# Patient Record
Sex: Female | Born: 1975 | Hispanic: Yes | Smoking: Never smoker
Health system: Southern US, Community
[De-identification: ages and names within clinical notes are randomized; demographics above are authoritative.]

## PROBLEM LIST (undated history)

## (undated) DIAGNOSIS — F329 Major depressive disorder, single episode, unspecified: Secondary | ICD-10-CM

## (undated) DIAGNOSIS — E785 Hyperlipidemia, unspecified: Secondary | ICD-10-CM

## (undated) DIAGNOSIS — F32A Depression, unspecified: Secondary | ICD-10-CM

## (undated) HISTORY — DX: Major depressive disorder, single episode, unspecified: F32.9

## (undated) HISTORY — DX: Depression, unspecified: F32.A

## (undated) HISTORY — DX: Hyperlipidemia, unspecified: E78.5

## (undated) HISTORY — PX: APPENDECTOMY: SHX54

---

## 1998-11-12 HISTORY — PX: CHOLECYSTECTOMY: SHX55

## 2018-09-09 ENCOUNTER — Ambulatory Visit: Payer: Managed Care, Other (non HMO) | Admitting: Physician Assistant

## 2018-09-10 ENCOUNTER — Ambulatory Visit (INDEPENDENT_AMBULATORY_CARE_PROVIDER_SITE_OTHER): Payer: Managed Care, Other (non HMO) | Admitting: Physician Assistant

## 2018-09-10 ENCOUNTER — Encounter: Payer: Self-pay | Admitting: Physician Assistant

## 2018-09-10 VITALS — BP 92/62 | HR 80 | Temp 98.0°F | Resp 16 | Ht 60.0 in | Wt 189.0 lb

## 2018-09-10 DIAGNOSIS — G43009 Migraine without aura, not intractable, without status migrainosus: Secondary | ICD-10-CM | POA: Diagnosis not present

## 2018-09-10 MED ORDER — KETOROLAC TROMETHAMINE 60 MG/2ML IM SOLN
60.0000 mg | Freq: Once | INTRAMUSCULAR | Status: AC
  Administered 2018-09-10: 60 mg via INTRAMUSCULAR

## 2018-09-10 MED ORDER — CYCLOBENZAPRINE HCL 5 MG PO TABS: 5.0000 mg | ORAL_TABLET | Freq: Every day | ORAL | 0 refills | Status: DC

## 2018-09-10 MED ORDER — PREDNISONE 20 MG PO TABS: 40.0000 mg | ORAL_TABLET | Freq: Every day | ORAL | 0 refills | Status: DC

## 2018-09-10 NOTE — Patient Instructions (Signed)
It was great to see you!  Start oral prednisone tomorrow. If symptoms persist, please let us know.   May use muscle relaxer, Flexeril, as soon as tonight to help with sleep.  Take care,  Jarold Motto PA-C

## 2018-09-10 NOTE — Progress Notes (Signed)
Jacqueline Marshall is a 42 y.o. female here for a new problem.  History of Present Illness:   Chief Complaint  Patient presents with  . Headache    HPI   Has history of migraines and stress-related headaches.  This headache that she currently has started this past week. Goes to the gym before work -- however has decreased so she can sleep more and is now going 3 x a week instead of 5 x a week. Recently has been under a lot of stress -- someone quit at her job and she is having to do a lot more work than usual. She also has 4 kids and goes to school. She did decide to put this semester on hold so she can try to take a little bit off of her plate.  Has taken Imitrex in the past and it has helped. Has also received Toradol injections in the past and tolerated well.   Does have some photophobia and slight ear pressure.  Denies: slurred speech, weakness, numbness/tingling, n/v, confusion, fever  States that this is typical for her HA and it is not the worst HA of her life.    Past Medical History:  Diagnosis Date  . Depression    zoloft, wellbutrin -- did feel like she had some SAD while in [place]  . Hyperlipidemia    never on medications     Social History   Socioeconomic History  . Marital status: Not on file    Spouse name: Not on file  . Number of children: Not on file  . Years of education: Not on file  . Highest education level: Not on file  Occupational History  . Not on file  Social Needs  . Financial resource strain: Not on file  . Food insecurity:    Worry: Not on file    Inability: Not on file  . Transportation needs:    Medical: Not on file    Non-medical: Not on file  Tobacco Use  . Smoking status: Not on file  Substance and Sexual Activity  . Alcohol use: Not on file  . Drug use: Not on file  . Sexual activity: Not on file  Lifestyle  . Physical activity:    Days per week: Not on file    Minutes per session: Not on file  . Stress: Not on file   Relationships  . Social connections:    Talks on phone: Not on file    Gets together: Not on file    Attends religious service: Not on file    Active member of club or organization: Not on file    Attends meetings of clubs or organizations: Not on file    Relationship status: Not on file  . Intimate partner violence:    Fear of current or ex partner: Not on file    Emotionally abused: Not on file    Physically abused: Not on file    Forced sexual activity: Not on file  Other Topics Concern  . Not on file  Social History Narrative   4 children -- 60, 35, 75, and 7 (3 boys and a girl)   Hx ectopic pregnancy   Work -- [employer]    Past Surgical History:  Procedure Laterality Date  . APPENDECTOMY    . CHOLECYSTECTOMY  2000    Family History  Problem Relation Age of Onset  . Cancer Mother  Breast  . Stroke Mother   . Cancer Maternal Grandfather        Lung    Not on File  Current Medications:   Current Outpatient Medications:  .  cyclobenzaprine (FLEXERIL) 5 MG tablet, Take 1 tablet (5 mg total) by mouth at bedtime., Disp: 20 tablet, Rfl: 0 .  predniSONE (DELTASONE) 20 MG tablet, Take 2 tablets (40 mg total) by mouth daily., Disp: 10 tablet, Rfl: 0   Review of Systems:   ROS Negative unless otherwise specified per HPI.  Vitals:   Vitals:   09/10/18 0807  BP: 92/62  Pulse: 80  Resp: 16  Temp: 98 F (36.7 C)  TempSrc: Oral  SpO2: 99%  Weight: 189 lb (85.7 kg)  Height: 5' (1.524 m)     Body mass index is 36.91 kg/m.  Physical Exam:   Physical Exam  Constitutional: She appears well-developed. She is cooperative.  Non-toxic appearance. She does not have a sickly appearance. She does not appear ill. No distress.  HENT:  Head: Normocephalic and atraumatic.  Right Ear: Tympanic membrane, external ear and ear canal normal. Tympanic membrane is not erythematous, not retracted and not bulging.  Left Ear: Tympanic membrane,  external ear and ear canal normal. Tympanic membrane is not erythematous, not retracted and not bulging.  Nose: Nose normal. Right sinus exhibits no maxillary sinus tenderness and no frontal sinus tenderness. Left sinus exhibits no maxillary sinus tenderness and no frontal sinus tenderness.  Mouth/Throat: Uvula is midline. No posterior oropharyngeal edema or posterior oropharyngeal erythema.  Eyes: Conjunctivae and lids are normal.  Neck: Trachea normal.  Cardiovascular: Normal rate, regular rhythm, S1 normal, S2 normal, normal heart sounds and normal pulses.  No LE edema  Pulmonary/Chest: Effort normal and breath sounds normal. She has no decreased breath sounds. She has no wheezes. She has no rhonchi. She has no rales.  Lymphadenopathy:    She has no cervical adenopathy.  Neurological: She is alert. She has normal strength. No cranial nerve deficit or sensory deficit. Coordination and gait normal. GCS eye subscore is 4. GCS verbal subscore is 5. GCS motor subscore is 6.  Skin: Skin is warm, dry and intact.  Psychiatric: She has a normal mood and affect. Her speech is normal and behavior is normal.  Nursing note and vitals reviewed.   Assessment and Plan:    Jacqueline Marshall was seen today for headache.  Diagnoses and all orders for this visit:  Migraine without aura and without status migrainosus, not intractable -     ketorolac (TORADOL) injection 60 mg  Other orders -     predniSONE (DELTASONE) 20 MG tablet; Take 2 tablets (40 mg total) by mouth daily. -     cyclobenzaprine (FLEXERIL) 5 MG tablet; Take 1 tablet (5 mg total) by mouth at bedtime.   Neuro exam benign. Suspect HA is mix of migraine and tension. Discussed working on sleep and reducing triggers if possible. Received toradol injection in office and tolerated well. She is developing a little bit of sinus pressure, discussed starting prednisone tomorrow and take x 5 days. May use flexeril for sleeping and neck tension. Follow-up if  symptoms worsen or persist despite treatment.   . Reviewed expectations re: course of current medical issues. . Discussed self-management of symptoms. . Outlined signs and symptoms indicating need for more acute intervention. . Patient verbalized understanding and all questions were answered. . See orders for this visit as documented in the electronic medical record. . Patient received an  After-Visit Summary.   Jarold Motto, PA-C

## 2018-09-18 ENCOUNTER — Telehealth: Payer: Self-pay | Admitting: Physician Assistant

## 2018-09-18 NOTE — Telephone Encounter (Signed)
Please see message and advise 

## 2018-09-18 NOTE — Telephone Encounter (Signed)
Copied from CRM [id number]. Topic: General - Other >> Sep 18, 2018  2:23 PM Gerrianne Scale wrote:  Reason for CRM: Pt calling stating that she still has a headache and was told that Bufford Buttner would send in an antibiotic [id number] [place], [place] - [address] [place] [phone] (Phone) [phone] (Fax)

## 2018-09-19 ENCOUNTER — Other Ambulatory Visit: Payer: Self-pay | Admitting: Physician Assistant

## 2018-09-19 MED ORDER — DOXYCYCLINE HYCLATE 100 MG PO TABS: 100.0000 mg | ORAL_TABLET | Freq: Two times a day (BID) | ORAL | 0 refills | Status: DC

## 2018-09-19 NOTE — Telephone Encounter (Signed)
Pt aware doxycycline sent to the pharmacy, and if not better to make an appt to be seen.

## 2018-09-19 NOTE — Telephone Encounter (Signed)
Left message on voicemail to call office.  

## 2018-09-19 NOTE — Telephone Encounter (Signed)
Noted  

## 2018-09-19 NOTE — Telephone Encounter (Signed)
I have sent in doxycycline for her sinus symptoms.  If symptoms do not improve or if they persist, needs follow-up with [place].

## 2019-01-06 ENCOUNTER — Encounter: Payer: Self-pay | Admitting: Physician Assistant

## 2019-01-06 ENCOUNTER — Ambulatory Visit (INDEPENDENT_AMBULATORY_CARE_PROVIDER_SITE_OTHER): Payer: BLUE CROSS/BLUE SHIELD | Admitting: Physician Assistant

## 2019-01-06 VITALS — BP 100/60 | HR 75 | Temp 98.6°F | Ht 60.0 in | Wt 190.4 lb

## 2019-01-06 DIAGNOSIS — F419 Anxiety disorder, unspecified: Secondary | ICD-10-CM | POA: Diagnosis not present

## 2019-01-06 DIAGNOSIS — F321 Major depressive disorder, single episode, moderate: Secondary | ICD-10-CM

## 2019-01-06 DIAGNOSIS — O24419 Gestational diabetes mellitus in pregnancy, unspecified control: Secondary | ICD-10-CM

## 2019-01-06 MED ORDER — ESCITALOPRAM OXALATE 10 MG PO TABS: 10.0000 mg | ORAL_TABLET | Freq: Every day | ORAL | 1 refills | Status: DC

## 2019-01-06 NOTE — Patient Instructions (Signed)
It was great to see you!  Start Lexapro 10 mg daily.  Return in 4-6 weeks. If suicidal thoughts, please go to the ER.  We will call you with lab results.  Take care,  Jarold Motto PA-C

## 2019-01-06 NOTE — Progress Notes (Signed)
Jacqueline Marshall is a 43 y.o. female here for a new problem.   History of Present Illness:   Chief Complaint  Patient presents with  . Mental Health Problem    FLMA     HPI   Patient presents for evaluations of depression and anxiety.  She is currently seeing a therapist, Aquilla Solian, at [facility] for [facility]. She has been seeing her weekly x 2 weeks. She has been dealing with increasing depression and anxiety over the past few months. Of note, when she was 43 y/o she had post-partum depression and SI. Believes that she suffers from SAD. She has been on medica tion in the past, including xanax prn, ativan prn, zoloft, wellbutrin. She is currently not on any medication and currently denies SI/HI. She was last on medications around 4 years ago, and at that time she went off her mental health medications and started multivitamin from [facility] and felt like she was in a better place. She is having some issues sleeping, problems staying asleep.   She does have history of gestational diabetes. Denies significant weight gain but does have trouble losing weight.  Wt Readings from Last 5 Encounters:  01/06/19 190 lb 6.1 oz (86.4 kg)  09/10/18 189 lb (85.7 kg)     Past Medical History:  Diagnosis Date  . Depression    zoloft, wellbutrin -- did feel like she had some SAD while in [place]  . Hyperlipidemia    never on medications     Social History   Socioeconomic History  . Marital status: Divorced    Spouse name: Not on file  . Number of children: Not on file  . Years of education: Not on file  . Highest education level: Not on file  Occupational History  . Not on file  Social Needs  . Financial resource strain: Not on file  . Food insecurity:    Worry: Not on file    Inability: Not on file  . Transportation needs:    Medical: Not on file    Non-medical: Not on file  Tobacco Use  . Smoking status: Never Smoker  . Smokeless tobacco: Never Used  Substance  and Sexual Activity  . Alcohol use: Yes    Comment: socially  . Drug use: Never  . Sexual activity: Yes    Birth control/protection: I.U.D.  Lifestyle  . Physical activity:    Days per week: Not on file    Minutes per session: Not on file  . Stress: Not on file  Relationships  . Social connections:    Talks on phone: Not on file    Gets together: Not on file    Attends religious service: Not on file    Active member of club or organization: Not on file    Attends meetings of clubs or organizations: Not on file    Relationship status: Not on file  . Intimate partner violence:    Fear of current or ex partner: Not on file    Emotionally abused: Not on file    Physically abused: Not on file    Forced sexual activity: Not on file  Other Topics Concern  . Not on file  Social History Narrative   4 children -- 13, 63, 30, and 7 (3 boys and a girl)   Hx ectopic pregnancy   Work -- [employer]    Past Surgical History:  Procedure Laterality Date  . APPENDECTOMY    .  CHOLECYSTECTOMY  2000    Family History  Problem Relation Age of Onset  . Cancer Mother        Breast  . Stroke Mother   . Cancer Maternal Grandfather        Lung    No Known Allergies  Current Medications:   Current Outpatient Medications:  .  cyclobenzaprine (FLEXERIL) 5 MG tablet, Take 1 tablet (5 mg total) by mouth at bedtime., Disp: 20 tablet, Rfl: 0 .  levonorgestrel (MIRENA, 52 MG,) 20 MCG/24HR IUD, 1 each by Intrauterine route once. Inserted by GYN about 2 years ago in [facility], good for 5 years., Disp: , Rfl:  .  Multiple Vitamins-Minerals (MULTIVITAMIN ADULTS PO), Take by mouth., Disp: , Rfl:  .  Probiotic Product (PROBIOTIC-10 PO), Take by mouth., Disp: , Rfl:  .  escitalopram (LEXAPRO) 10 MG tablet, Take 1 tablet (10 mg total) by mouth daily., Disp: 30 tablet, Rfl: 1   Review of Systems:   Review of Systems  Constitutional: Negative for chills, fever,  malaise/fatigue and weight loss.  Respiratory: Negative for shortness of breath.   Cardiovascular: Negative for chest pain, orthopnea, claudication and leg swelling.  Gastrointestinal: Negative for heartburn, nausea and vomiting.  Neurological: Negative for dizziness, tingling and headaches.  Psychiatric/Behavioral: Positive for depression. Negative for suicidal ideas. The patient is nervous/anxious and has insomnia.     Vitals:   Vitals:   01/06/19 1453  BP: 100/60  Pulse: 75  Temp: 98.6 F (37 C)  TempSrc: Oral  SpO2: 100%  Weight: 190 lb 6.1 oz (86.4 kg)  Height: 5' (1.524 m)     Body mass index is 37.18 kg/m.  Physical Exam:   Physical Exam Vitals signs and nursing note reviewed.  Constitutional:      General: She is not in acute distress.    Appearance: She is well-developed. She is not ill-appearing or toxic-appearing.  Cardiovascular:     Rate and Rhythm: Normal rate and regular rhythm.     Pulses: Normal pulses.     Heart sounds: Normal heart sounds, S1 normal and S2 normal.     Comments: No LE edema Pulmonary:     Effort: Pulmonary effort is normal.     Breath sounds: Normal breath sounds.  Skin:    General: Skin is warm and dry.  Neurological:     Mental Status: She is alert.     GCS: GCS eye subscore is 4. GCS verbal subscore is 5. GCS motor subscore is 6.  Psychiatric:        Speech: Speech normal.        Behavior: Behavior normal. Behavior is cooperative.     No results found for this or any previous visit.  Assessment and Plan:   [name] was seen today for mental health problem.  Diagnoses and all orders for this visit:  Depression, major, single episode, moderate (HCC) and Anxiety Denies SI/HI presently. Start Lexapro 10 mg daily. Consider Trazodone for insomnia. Will check labs as well to r/o organic cause. F/u in 1 month, sooner if problems. I discussed with patient that if they develop any SI, to tell someone immediately and seek medical  attention. -     CBC with Differential/Platelet; Future -     Comprehensive metabolic panel; Future       -     TSH; Future   Gestational diabetes mellitus (GDM), antepartum, gestational diabetes method of control unspecified -     Hemoglobin A1c; Future  Other orders -     escitalopram (LEXAPRO) 10 MG tablet; Take 1 tablet (10 mg total) by mouth daily.    . Reviewed expectations re: course of current medical issues. . Discussed self-management of symptoms. . Outlined signs and symptoms indicating need for more acute intervention. . Patient verbalized understanding and all questions were answered. . See orders for this visit as documented in the electronic medical record. . Patient received an After-Visit Summary.  CMA or LPN served as scribe during this visit. History, Physical, and Plan performed by medical provider. The above documentation has been reviewed and is accurate and complete.   Jarold Motto, PA-C

## 2019-01-07 ENCOUNTER — Telehealth: Payer: Self-pay | Admitting: Physician Assistant

## 2019-01-07 NOTE — Telephone Encounter (Signed)
See note  Copied from CRM [id number]. Topic: General - Other >> Jan 07, 2019  3:59 PM Jacqueline Marshall wrote: Reason for CRM: Pt wants to see if something can be prescribed for her anxiety/ please advise

## 2019-01-07 NOTE — Telephone Encounter (Signed)
[name], please see message. 

## 2019-01-08 ENCOUNTER — Other Ambulatory Visit: Payer: Self-pay | Admitting: Physician Assistant

## 2019-01-08 ENCOUNTER — Ambulatory Visit (INDEPENDENT_AMBULATORY_CARE_PROVIDER_SITE_OTHER): Payer: BLUE CROSS/BLUE SHIELD | Admitting: Physician Assistant

## 2019-01-08 ENCOUNTER — Encounter: Payer: Self-pay | Admitting: Physician Assistant

## 2019-01-08 VITALS — BP 118/76 | HR 75 | Temp 97.7°F | Ht 60.0 in | Wt 189.0 lb

## 2019-01-08 DIAGNOSIS — F321 Major depressive disorder, single episode, moderate: Secondary | ICD-10-CM

## 2019-01-08 DIAGNOSIS — H6690 Otitis media, unspecified, unspecified ear: Secondary | ICD-10-CM

## 2019-01-08 DIAGNOSIS — G4700 Insomnia, unspecified: Secondary | ICD-10-CM

## 2019-01-08 DIAGNOSIS — O24419 Gestational diabetes mellitus in pregnancy, unspecified control: Secondary | ICD-10-CM | POA: Diagnosis not present

## 2019-01-08 LAB — CBC WITH DIFFERENTIAL/PLATELET
Basophils Absolute: 0 10*3/uL (ref 0.0–0.1)
Basophils Relative: 0.4 % (ref 0.0–3.0)
EOS ABS: 0.1 10*3/uL (ref 0.0–0.7)
Eosinophils Relative: 0.8 % (ref 0.0–5.0)
HCT: 37.8 % (ref 36.0–46.0)
Hemoglobin: 12.6 g/dL (ref 12.0–15.0)
Lymphocytes Relative: 22.2 % (ref 12.0–46.0)
Lymphs Abs: 1.6 10*3/uL (ref 0.7–4.0)
MCHC: 33.3 g/dL (ref 30.0–36.0)
MCV: 83 fl (ref 78.0–100.0)
Monocytes Absolute: 0.6 10*3/uL (ref 0.1–1.0)
Monocytes Relative: 8.1 % (ref 3.0–12.0)
Neutro Abs: 5 10*3/uL (ref 1.4–7.7)
Neutrophils Relative %: 68.5 % (ref 43.0–77.0)
Platelets: 322 10*3/uL (ref 150.0–400.0)
RBC: 4.55 Mil/uL (ref 3.87–5.11)
RDW: 13.4 % (ref 11.5–15.5)
WBC: 7.4 10*3/uL (ref 4.0–10.5)

## 2019-01-08 LAB — COMPREHENSIVE METABOLIC PANEL
ALBUMIN: 4 g/dL (ref 3.5–5.2)
ALK PHOS: 73 U/L (ref 39–117)
ALT: 10 U/L (ref 0–35)
AST: 13 U/L (ref 0–37)
BUN: 12 mg/dL (ref 6–23)
CO2: 26 mEq/L (ref 19–32)
Calcium: 9 mg/dL (ref 8.4–10.5)
Chloride: 101 mEq/L (ref 96–112)
Creatinine, Ser: 0.82 mg/dL (ref 0.40–1.20)
GFR: 76.66 mL/min (ref >60.00)
Glucose, Bld: 129 mg/dL — ABNORMAL HIGH (ref 70–99)
Potassium: 4 mEq/L (ref 3.5–5.1)
Sodium: 135 mEq/L (ref 135–145)
Total Bilirubin: 0.4 mg/dL (ref 0.2–1.2)
Total Protein: 7.4 g/dL (ref 6.0–8.3)

## 2019-01-08 LAB — TSH: TSH: 2.8 u[IU]/mL (ref 0.35–4.50)

## 2019-01-08 LAB — HEMOGLOBIN A1C: Hgb A1c MFr Bld: 5.6 % (ref 4.6–6.5)

## 2019-01-08 MED ORDER — AMOXICILLIN 875 MG PO TABS: 875.0000 mg | ORAL_TABLET | Freq: Two times a day (BID) | ORAL | 0 refills | Status: DC

## 2019-01-08 MED ORDER — TRAZODONE HCL 50 MG PO TABS: 25.0000 mg | ORAL_TABLET | Freq: Every evening | ORAL | 1 refills | Status: DC | PRN

## 2019-01-08 MED ORDER — HYDROXYZINE HCL 25 MG PO TABS: ORAL_TABLET | ORAL | 0 refills | Status: AC

## 2019-01-08 NOTE — Telephone Encounter (Signed)
Pt came in for an appt and was notified about Rx for Atarax. Pt verbalized understanding.

## 2019-01-08 NOTE — Progress Notes (Signed)
Jacqueline Marshall is a 42 y.o. female here for a new problem.  I acted as a Neurosurgeon for [employer], PA-C Corky Mull, LPN  History of Present Illness:   Chief Complaint  Patient presents with  . Ear Pain    Otalgia   There is pain in both ears. This is a new problem. The current episode started yesterday. The problem occurs constantly. The problem has been gradually worsening. There has been no fever. The pain is at a severity of 7/10. The pain is moderate. Associated symptoms include headaches, neck pain and a sore throat (itchy). Pertinent negatives include no coughing or ear discharge. She has tried nothing for the symptoms. Her past medical history is significant for a chronic ear infection and a tympanostomy tube.   Insomnia Continuing to have issues with insomnia. Lack of sleep is not helping her mood. Denies SI/HI.  Past Medical History:  Diagnosis Date  . Depression    zoloft, wellbutrin -- did feel like she had some SAD while in [place]  . Hyperlipidemia    never on medications     Social History   Socioeconomic History  . Marital status: Divorced    Spouse name: Not on file  . Number of children: Not on file  . Years of education: Not on file  . Highest education level: Not on file  Occupational History  . Not on file  Social Needs  . Financial resource strain: Not on file  . Food insecurity:    Worry: Not on file    Inability: Not on file  . Transportation needs:    Medical: Not on file    Non-medical: Not on file  Tobacco Use  . Smoking status: Never Smoker  . Smokeless tobacco: Never Used  Substance and Sexual Activity  . Alcohol use: Yes    Comment: socially  . Drug use: Never  . Sexual activity: Yes    Birth control/protection: I.U.D.  Lifestyle  . Physical activity:    Days per week: Not on file    Minutes per session: Not on file  . Stress: Not on file  Relationships  . Social connections:    Talks on phone: Not on file     Gets together: Not on file    Attends religious service: Not on file    Active member of club or organization: Not on file    Attends meetings of clubs or organizations: Not on file    Relationship status: Not on file  . Intimate partner violence:    Fear of current or ex partner: Not on file    Emotionally abused: Not on file    Physically abused: Not on file    Forced sexual activity: Not on file  Other Topics Concern  . Not on file  Social History Narrative   4 children -- 68, 14, 96, and 7 (3 boys and a girl)   Hx ectopic pregnancy   Work -- [employer]    Past Surgical History:  Procedure Laterality Date  . APPENDECTOMY    . CHOLECYSTECTOMY  2000    Family History  Problem Relation Age of Onset  . Cancer Mother        Breast  . Stroke Mother   . Cancer Maternal Grandfather        Lung    No Known Allergies  Current Medications:   Current Outpatient Medications:  .  cyclobenzaprine (FLEXERIL) 5 MG  tablet, Take 1 tablet (5 mg total) by mouth at bedtime., Disp: 20 tablet, Rfl: 0 .  escitalopram (LEXAPRO) 10 MG tablet, Take 1 tablet (10 mg total) by mouth daily., Disp: 30 tablet, Rfl: 1 .  hydrOXYzine (ATARAX/VISTARIL) 25 MG tablet, Take one 25 mg tablet 30-60 minutes prior to bedtime for insomnia, anxiety. May increase to two tablets., Disp: 60 tablet, Rfl: 0 .  levonorgestrel (MIRENA, 52 MG,) 20 MCG/24HR IUD, 1 each by Intrauterine route once. Inserted by GYN about 2 years ago in [facility], good for 5 years., Disp: , Rfl:  .  Multiple Vitamins-Minerals (MULTIVITAMIN ADULTS PO), Take by mouth., Disp: , Rfl:  .  Probiotic Product (PROBIOTIC-10 PO), Take by mouth., Disp: , Rfl:  .  amoxicillin (AMOXIL) 875 MG tablet, Take 1 tablet (875 mg total) by mouth 2 (two) times daily., Disp: 20 tablet, Rfl: 0 .  traZODone (DESYREL) 50 MG tablet, Take 0.5-1 tablets (25-50 mg total) by mouth at bedtime as needed for sleep., Disp: 30 tablet, Rfl: 1    Review of Systems:   Review of Systems  HENT: Positive for ear pain and sore throat (itchy). Negative for ear discharge.   Respiratory: Negative for cough.   Musculoskeletal: Positive for neck pain.  Neurological: Positive for headaches.    Vitals:   Vitals:   01/08/19 0758  BP: 118/76  Pulse: 75  Temp: 97.7 F (36.5 C)  TempSrc: Oral  SpO2: 100%  Weight: 189 lb (85.7 kg)  Height: 5' (1.524 m)     Body mass index is 36.91 kg/m.  Physical Exam:   Physical Exam Vitals signs and nursing note reviewed.  Constitutional:      General: She is not in acute distress.    Appearance: She is well-developed. She is not ill-appearing or toxic-appearing.  HENT:     Head: Normocephalic and atraumatic.     Right Ear: Ear canal and external ear normal. Tympanic membrane is erythematous. Tympanic membrane is not retracted or bulging.     Left Ear: Ear canal and external ear normal. Tympanic membrane is erythematous. Tympanic membrane is not retracted or bulging.     Ears:     Comments: R ear more erythematous than L    Nose: Mucosal edema, congestion and rhinorrhea present.     Right Sinus: No maxillary sinus tenderness or frontal sinus tenderness.     Left Sinus: No maxillary sinus tenderness or frontal sinus tenderness.     Mouth/Throat:     Lips: Pink.     Mouth: Mucous membranes are moist.     Pharynx: Uvula midline. Posterior oropharyngeal erythema present.     Tonsils: Swelling: 2+ on the right. 2+ on the left.  Eyes:     General: Lids are normal.     Conjunctiva/sclera: Conjunctivae normal.  Neck:     Trachea: Trachea normal.  Cardiovascular:     Rate and Rhythm: Normal rate and regular rhythm.     Pulses: Normal pulses.     Heart sounds: Normal heart sounds, S1 normal and S2 normal.     Comments: No LE edema Pulmonary:     Effort: Pulmonary effort is normal.     Breath sounds: Normal breath sounds. No decreased breath sounds, wheezing, rhonchi or rales.   Lymphadenopathy:     Cervical: No cervical adenopathy.  Skin:    General: Skin is warm and dry.  Neurological:     Mental Status: She is alert.     GCS: GCS  eye subscore is 4. GCS verbal subscore is 5. GCS motor subscore is 6.  Psychiatric:        Speech: Speech normal.        Behavior: Behavior normal. Behavior is cooperative.     No results found for this or any previous visit.  Assessment and Plan:   Junie was seen today for ear pain.  Diagnoses and all orders for this visit:  Acute otitis media, unspecified otitis media type No red flags on exam.  Suspect R AOM. Will initiate amoxicillin per orders. Discussed taking medications as prescribed. Reviewed return precautions including worsening fever, SOB, worsening cough or other concerns. Push fluids and rest. I recommend that patient follow-up if symptoms worsen or persist despite treatment x 7-10 days, sooner if needed.  Insomnia Trial trazodone. Start with 1/2 tablet. If ineffective, may trial 1 full tablet.   Other orders -     traZODone (DESYREL) 50 MG tablet; Take 0.5-1 tablets (25-50 mg total) by mouth at bedtime as needed for sleep. -     amoxicillin (AMOXIL) 875 MG tablet; Take 1 tablet (875 mg total) by mouth 2 (two) times daily.    . Reviewed expectations re: course of current medical issues. . Discussed self-management of symptoms. . Outlined signs and symptoms indicating need for more acute intervention. . Patient verbalized understanding and all questions were answered. . See orders for this visit as documented in the electronic medical record. . Patient received an After-Visit Summary.  CMA or LPN served as scribe during this visit. History, Physical, and Plan performed by medical provider. The above documentation has been reviewed and is accurate and complete.   Jarold Motto, PA-C

## 2019-01-08 NOTE — Patient Instructions (Signed)
It was great to see you!  Use medication as prescribed: amoxicillin  Push fluids and get plenty of rest. Please return if you are not improving as expected, or if you have high fevers (>101.5) or difficulty swallowing or worsening productive cough.   For sleep, use the trazodone. Start with 1/2 tablet (25 mg). May trial 1 tablet (50 mg) the next night if ineffective.   If atarax doesn't help with your anxiety, I will send in a few ativan for you, please let me know.

## 2019-01-08 NOTE — Telephone Encounter (Signed)
I will send in atarax prn for her. If this is ineffective, please follow-up in office.

## 2019-01-09 ENCOUNTER — Other Ambulatory Visit: Payer: BLUE CROSS/BLUE SHIELD

## 2019-01-09 NOTE — Telephone Encounter (Signed)
See result note.  

## 2019-01-09 NOTE — Telephone Encounter (Signed)
See note

## 2019-01-09 NOTE — Telephone Encounter (Signed)
The patient is calling back for Corky Mull. Thanks.

## 2019-01-12 ENCOUNTER — Telehealth: Payer: Self-pay | Admitting: Physician Assistant

## 2019-01-12 ENCOUNTER — Encounter: Payer: Self-pay | Admitting: Physician Assistant

## 2019-01-12 ENCOUNTER — Ambulatory Visit (INDEPENDENT_AMBULATORY_CARE_PROVIDER_SITE_OTHER): Payer: BLUE CROSS/BLUE SHIELD | Admitting: Physician Assistant

## 2019-01-12 VITALS — BP 106/70 | HR 80 | Temp 98.6°F | Ht 60.0 in | Wt 191.0 lb

## 2019-01-12 DIAGNOSIS — R05 Cough: Secondary | ICD-10-CM | POA: Diagnosis not present

## 2019-01-12 DIAGNOSIS — R059 Cough, unspecified: Secondary | ICD-10-CM

## 2019-01-12 MED ORDER — VALACYCLOVIR HCL 1 G PO TABS: ORAL_TABLET | ORAL | 2 refills | Status: DC

## 2019-01-12 MED ORDER — HYDROCOD POLST-CPM POLST ER 10-8 MG/5ML PO SUER: 5.0000 mL | Freq: Every evening | ORAL | 0 refills | Status: DC | PRN

## 2019-01-12 NOTE — Telephone Encounter (Signed)
Called pt and told her need to keep appt today at 12:40 with [facility]. Pt verbalized understanding.

## 2019-01-12 NOTE — Telephone Encounter (Signed)
Please see message and advise 

## 2019-01-12 NOTE — Telephone Encounter (Signed)
Needs to keep appointment

## 2019-01-12 NOTE — Patient Instructions (Signed)
It was great to see you!  We will call you with your chest xray results and the plan for your antibiotic.  Start mucinex to help thin out mucus. Try to choose one with DM to help suppress cough.  Use valtrex for fever blister.  If swollen area on jaw does not improve after two weeks, come back to see me.  Push fluids and get plenty of rest. Please return if you are not improving as expected, or if you have high fevers (>101.5) or difficulty swallowing or worsening productive cough.  Call clinic with questions.  I hope you start feeling better soon!

## 2019-01-12 NOTE — Telephone Encounter (Signed)
Copied from CRM [id number]. Topic: Quick Communication - Rx Refill/Question >> Jan 12, 2019  8:57 AM Crist Infante wrote: Medication: abx/something else as not better  Pt seen last Thursday, and reports she is not better.  Pt states she has now developed a cold sore, cannot sleep at night due to all her coughing, and generally does not feel any better. Pt initially made an appt for today to be seen, but at the end of conversation, requested [facility] nurse call her back to see if anything could be called in instead. Pt will keep her appt, unless you advise there can be something called in.  [id number] [place], [place] - [address] [phone] (Phone) [phone] (Fax)

## 2019-01-12 NOTE — Telephone Encounter (Signed)
See note  Copied from CRM [id number]. Topic: General - Other >> Jan 12, 2019  1:50 PM Carin Primrose F wrote: Reason for CRM:  Patient wanted to know if she should take more of the old antibiotic or should she take a new one. >> Jan 12, 2019  1:53 PM Trula Slade wrote: Patient stated to disregard the message,

## 2019-01-12 NOTE — Progress Notes (Signed)
Jacqueline Marshall is a 43 y.o. female here for a follow up of a pre-existing problem.  I acted as a Neurosurgeon for [employer], PA-C Corky Mull, LPN  History of Present Illness:   Chief Complaint  Patient presents with  . Cough    Cough  This is a recurrent problem. Episode onset: Pt was seen on 2/27 and is not feeling any better. The problem has been gradually worsening. The problem occurs constantly (at night). The cough is productive of sputum (coughing spells and vomits). Associated symptoms include ear pain, headaches, nasal congestion and postnasal drip. Pertinent negatives include no chills, fever, sore throat or shortness of breath. Associated symptoms comments: fatigue. The symptoms are aggravated by cold air and lying down. She has tried OTC cough suppressant (amoxicillin) for the symptoms. The treatment provided no relief. There is no history of asthma, bronchitis or pneumonia.   She has also developed a cold sore. She gets these when she is under stress.   Past Medical History:  Diagnosis Date  . Depression    zoloft, wellbutrin -- did feel like she had some SAD while in [place]  . Hyperlipidemia    never on medications     Social History   Socioeconomic History  . Marital status: Divorced    Spouse name: Not on file  . Number of children: Not on file  . Years of education: Not on file  . Highest education level: Not on file  Occupational History  . Not on file  Social Needs  . Financial resource strain: Not on file  . Food insecurity:    Worry: Not on file    Inability: Not on file  . Transportation needs:    Medical: Not on file    Non-medical: Not on file  Tobacco Use  . Smoking status: Never Smoker  . Smokeless tobacco: Never Used  Substance and Sexual Activity  . Alcohol use: Yes    Comment: socially  . Drug use: Never  . Sexual activity: Yes    Birth control/protection: I.U.D.  Lifestyle  . Physical activity:    Days per week: Not on  file    Minutes per session: Not on file  . Stress: Not on file  Relationships  . Social connections:    Talks on phone: Not on file    Gets together: Not on file    Attends religious service: Not on file    Active member of club or organization: Not on file    Attends meetings of clubs or organizations: Not on file    Relationship status: Not on file  . Intimate partner violence:    Fear of current or ex partner: Not on file    Emotionally abused: Not on file    Physically abused: Not on file    Forced sexual activity: Not on file  Other Topics Concern  . Not on file  Social History Narrative   4 children -- 38, 35, 36, and 7 (3 boys and a girl)   Hx ectopic pregnancy   Work -- [employer]    Past Surgical History:  Procedure Laterality Date  . APPENDECTOMY    . CHOLECYSTECTOMY  2000    Family History  Problem Relation Age of Onset  . Cancer Mother        Breast  . Stroke Mother   . Cancer Maternal Grandfather        Lung  No Known Allergies  Current Medications:   Current Outpatient Medications:  .  amoxicillin (AMOXIL) 875 MG tablet, Take 1 tablet (875 mg total) by mouth 2 (two) times daily., Disp: 20 tablet, Rfl: 0 .  cyclobenzaprine (FLEXERIL) 5 MG tablet, Take 1 tablet (5 mg total) by mouth at bedtime. (Patient taking differently: Take 5 mg by mouth at bedtime as needed. ), Disp: 20 tablet, Rfl: 0 .  escitalopram (LEXAPRO) 10 MG tablet, Take 1 tablet (10 mg total) by mouth daily., Disp: 30 tablet, Rfl: 1 .  hydrOXYzine (ATARAX/VISTARIL) 25 MG tablet, Take one 25 mg tablet 30-60 minutes prior to bedtime for insomnia, anxiety. May increase to two tablets., Disp: 60 tablet, Rfl: 0 .  levonorgestrel (MIRENA, 52 MG,) 20 MCG/24HR IUD, 1 each by Intrauterine route once. Inserted by GYN about 2 years ago in [facility], good for 5 years., Disp: , Rfl:  .  Multiple Vitamins-Minerals (MULTIVITAMIN ADULTS PO), Take by mouth., Disp: , Rfl:  .   Probiotic Product (PROBIOTIC-10 PO), Take by mouth., Disp: , Rfl:  .  traZODone (DESYREL) 50 MG tablet, Take 0.5-1 tablets (25-50 mg total) by mouth at bedtime as needed for sleep., Disp: 30 tablet, Rfl: 1 .  chlorpheniramine-HYDROcodone (TUSSIONEX PENNKINETIC ER) 10-8 MG/5ML SUER, Take 5 mLs by mouth at bedtime as needed for cough., Disp: 70 mL, Rfl: 0 .  valACYclovir (VALTREX) 1000 MG tablet, Take two tablets ( total 2000 mg) by mouth q12h x 1 day; Start: ASAP after symptom onset, Disp: 6 tablet, Rfl: 2   Review of Systems:   Review of Systems  Constitutional: Negative for chills and fever.  HENT: Positive for ear pain and postnasal drip. Negative for sore throat.   Respiratory: Positive for cough. Negative for shortness of breath.   Neurological: Positive for headaches.    Vitals:   Vitals:   01/12/19 1245  BP: 106/70  Pulse: 80  Temp: 98.6 F (37 C)  TempSrc: Oral  SpO2: 99%  Weight: 191 lb (86.6 kg)  Height: 5' (1.524 m)     Body mass index is 37.3 kg/m.  Physical Exam:   Physical Exam Vitals signs and nursing note reviewed.  Constitutional:      General: She is not in acute distress.    Appearance: She is well-developed. She is not ill-appearing or toxic-appearing.  HENT:     Head: Normocephalic and atraumatic.     Right Ear: Ear canal and external ear normal. A middle ear effusion is present. Tympanic membrane is erythematous. Tympanic membrane is not retracted or bulging.     Left Ear: Tympanic membrane, ear canal and external ear normal. Tympanic membrane is not erythematous, retracted or bulging.     Nose: Mucosal edema, congestion and rhinorrhea present.     Right Sinus: No maxillary sinus tenderness or frontal sinus tenderness.     Left Sinus: No maxillary sinus tenderness or frontal sinus tenderness.     Mouth/Throat:     Lips: Pink.     Mouth: Mucous membranes are moist.     Pharynx: Uvula midline. Posterior oropharyngeal erythema present.   Eyes:      General: Lids are normal.     Conjunctiva/sclera: Conjunctivae normal.  Neck:     Trachea: Trachea normal.  Cardiovascular:     Rate and Rhythm: Normal rate and regular rhythm.     Heart sounds: Normal heart sounds, S1 normal and S2 normal.  Pulmonary:     Effort: Pulmonary effort is normal.  Breath sounds: Normal breath sounds. No decreased breath sounds, wheezing, rhonchi or rales.  Lymphadenopathy:     Head:     Right side of head: Submental adenopathy present.     Cervical: Cervical adenopathy present.  Skin:    General: Skin is warm and dry.  Neurological:     Mental Status: She is alert.  Psychiatric:        Speech: Speech normal.        Behavior: Behavior normal. Behavior is cooperative.      Assessment and Plan:   Brylee was seen today for cough.  Diagnoses and all orders for this visit:  Cough -     DG Chest 2 View; Future  Other orders -     chlorpheniramine-HYDROcodone (TUSSIONEX PENNKINETIC ER) 10-8 MG/5ML SUER; Take 5 mLs by mouth at bedtime as needed for cough. -     valACYclovir (VALTREX) 1000 MG tablet; Take two tablets ( total 2000 mg) by mouth q12h x 1 day; Start: ASAP after symptom onset   No red flags on exam.  Worsening R AOM, will ramp up antibiotic rx after review of CXR to r/o PNA. Will add tussionex and valtrex per orders. Discussed taking medications as prescribed. Recommended taking mucinex with dextromethorphan to help with symptoms.  Reviewed return precautions including worsening fever, SOB, worsening cough or other concerns. Push fluids and rest. I recommend that patient follow-up if symptoms worsen or persist despite treatment x 7-10 days, sooner if needed.  She was advised to follow-up in two weeks if LAD did not improve.  . Reviewed expectations re: course of current medical issues. . Discussed self-management of symptoms. . Outlined signs and symptoms indicating need for more acute intervention. . Patient verbalized understanding  and all questions were answered. . See orders for this visit as documented in the electronic medical record. . Patient received an After-Visit Summary.  CMA or LPN served as scribe during this visit. History, Physical, and Plan performed by medical provider. The above documentation has been reviewed and is accurate and complete.  Jarold Motto, PA-C

## 2019-01-12 NOTE — Telephone Encounter (Signed)
Noted  

## 2019-01-13 ENCOUNTER — Ambulatory Visit (INDEPENDENT_AMBULATORY_CARE_PROVIDER_SITE_OTHER): Payer: BLUE CROSS/BLUE SHIELD

## 2019-01-13 DIAGNOSIS — R05 Cough: Secondary | ICD-10-CM | POA: Diagnosis not present

## 2019-01-13 DIAGNOSIS — R059 Cough, unspecified: Secondary | ICD-10-CM

## 2019-01-15 ENCOUNTER — Other Ambulatory Visit: Payer: Self-pay | Admitting: Physician Assistant

## 2019-01-15 MED ORDER — AZITHROMYCIN 250 MG PO TABS: ORAL_TABLET | ORAL | 0 refills | Status: DC

## 2019-01-23 DIAGNOSIS — Z01419 Encounter for gynecological examination (general) (routine) without abnormal findings: Secondary | ICD-10-CM | POA: Diagnosis not present

## 2019-01-23 DIAGNOSIS — Z30432 Encounter for removal of intrauterine contraceptive device: Secondary | ICD-10-CM | POA: Diagnosis not present

## 2019-01-23 DIAGNOSIS — Z6835 Body mass index (BMI) 35.0-35.9, adult: Secondary | ICD-10-CM | POA: Diagnosis not present

## 2019-02-03 ENCOUNTER — Ambulatory Visit: Payer: BLUE CROSS/BLUE SHIELD | Admitting: Physician Assistant

## 2019-02-27 ENCOUNTER — Telehealth: Payer: Self-pay | Admitting: *Deleted

## 2019-02-27 NOTE — Telephone Encounter (Signed)
Left message on voicemail to call office. Needs to schedule follow up for Anxiety and Depression with [name].

## 2019-02-27 NOTE — Telephone Encounter (Signed)
See note

## 2019-02-27 NOTE — Telephone Encounter (Signed)
Pt called and stated she is currently not working and doesn't have insurance so se will call and schedule when things change

## 2019-02-27 NOTE — Telephone Encounter (Signed)
Noted  

## 2019-04-30 ENCOUNTER — Telehealth: Payer: Self-pay | Admitting: Physician Assistant

## 2019-04-30 MED ORDER — VALACYCLOVIR HCL 1 G PO TABS: ORAL_TABLET | ORAL | 2 refills | Status: DC

## 2019-04-30 NOTE — Telephone Encounter (Signed)
Medication Refill - Medication:valACYclovir (VALTREX) 1000 MG tablet    Has the patient contacted their pharmacy? No. (Agent: If no, request that the patient contact the pharmacy for the refill.) (Agent: If yes, when and what did the pharmacy advise?)  Preferred Pharmacy (with phone number or street name): [facility] [address]    Agent: Please be advised that RX refills may take up to 3 business days. We ask that you follow-up with your pharmacy.

## 2019-04-30 NOTE — Telephone Encounter (Signed)
Rx sent to pharmacy   

## 2019-09-04 ENCOUNTER — Other Ambulatory Visit: Payer: Self-pay

## 2019-09-04 DIAGNOSIS — Z20822 Contact with and (suspected) exposure to covid-19: Secondary | ICD-10-CM

## 2019-09-05 LAB — NOVEL CORONAVIRUS, NAA: SARS-CoV-2, NAA: NOT DETECTED

## 2019-11-24 ENCOUNTER — Other Ambulatory Visit: Payer: Self-pay | Admitting: *Deleted

## 2019-11-24 MED ORDER — VALACYCLOVIR HCL 1 G PO TABS: ORAL_TABLET | ORAL | 0 refills | Status: DC

## 2019-12-25 ENCOUNTER — Ambulatory Visit: Payer: 59 | Attending: Internal Medicine

## 2019-12-25 DIAGNOSIS — Z20822 Contact with and (suspected) exposure to covid-19: Secondary | ICD-10-CM

## 2019-12-27 LAB — NOVEL CORONAVIRUS, NAA: SARS-CoV-2, NAA: NOT DETECTED

## 2020-01-14 ENCOUNTER — Telehealth: Payer: Self-pay

## 2020-01-14 NOTE — Telephone Encounter (Signed)
Patient refused flu shot.

## 2020-01-22 ENCOUNTER — Encounter: Payer: Self-pay | Admitting: Physician Assistant

## 2020-02-16 ENCOUNTER — Other Ambulatory Visit: Payer: Self-pay | Admitting: Physician Assistant

## 2020-02-16 MED ORDER — VALACYCLOVIR HCL 1 G PO TABS: ORAL_TABLET | ORAL | 2 refills | Status: DC

## 2020-02-17 ENCOUNTER — Encounter: Payer: Self-pay | Admitting: Physician Assistant

## 2020-02-17 ENCOUNTER — Other Ambulatory Visit: Payer: Self-pay

## 2020-02-17 ENCOUNTER — Ambulatory Visit (INDEPENDENT_AMBULATORY_CARE_PROVIDER_SITE_OTHER): Payer: 59 | Admitting: Physician Assistant

## 2020-02-17 VITALS — BP 124/74 | HR 68 | Temp 98.4°F | Ht 61.0 in | Wt 151.0 lb

## 2020-02-17 DIAGNOSIS — E663 Overweight: Secondary | ICD-10-CM

## 2020-02-17 DIAGNOSIS — Z0001 Encounter for general adult medical examination with abnormal findings: Secondary | ICD-10-CM

## 2020-02-17 DIAGNOSIS — L700 Acne vulgaris: Secondary | ICD-10-CM

## 2020-02-17 DIAGNOSIS — G4700 Insomnia, unspecified: Secondary | ICD-10-CM | POA: Diagnosis not present

## 2020-02-17 DIAGNOSIS — Z8349 Family history of other endocrine, nutritional and metabolic diseases: Secondary | ICD-10-CM | POA: Diagnosis not present

## 2020-02-17 DIAGNOSIS — Z13220 Encounter for screening for lipoid disorders: Secondary | ICD-10-CM | POA: Diagnosis not present

## 2020-02-17 DIAGNOSIS — Z136 Encounter for screening for cardiovascular disorders: Secondary | ICD-10-CM | POA: Diagnosis not present

## 2020-02-17 LAB — CBC WITH DIFFERENTIAL/PLATELET
Basophils Absolute: 0 10*3/uL (ref 0.0–0.1)
Basophils Relative: 0.3 % (ref 0.0–3.0)
Eosinophils Absolute: 0.1 10*3/uL (ref 0.0–0.7)
Eosinophils Relative: 1.1 % (ref 0.0–5.0)
HCT: 36.2 % (ref 36.0–46.0)
Hemoglobin: 12 g/dL (ref 12.0–15.0)
Lymphocytes Relative: 27 % (ref 12.0–46.0)
Lymphs Abs: 1.7 10*3/uL (ref 0.7–4.0)
MCHC: 33.1 g/dL (ref 30.0–36.0)
MCV: 86 fl (ref 78.0–100.0)
Monocytes Absolute: 0.4 10*3/uL (ref 0.1–1.0)
Monocytes Relative: 6.6 % (ref 3.0–12.0)
Neutro Abs: 4.2 10*3/uL (ref 1.4–7.7)
Neutrophils Relative %: 65 % (ref 43.0–77.0)
Platelets: 290 10*3/uL (ref 150.0–400.0)
RBC: 4.21 Mil/uL (ref 3.87–5.11)
RDW: 13.7 % (ref 11.5–15.5)
WBC: 6.4 10*3/uL (ref 4.0–10.5)

## 2020-02-17 LAB — COMPREHENSIVE METABOLIC PANEL
ALT: 8 U/L (ref 0–35)
AST: 14 U/L (ref 0–37)
Albumin: 4 g/dL (ref 3.5–5.2)
Alkaline Phosphatase: 57 U/L (ref 39–117)
BUN: 8 mg/dL (ref 6–23)
CO2: 27 mEq/L (ref 19–32)
Calcium: 9.2 mg/dL (ref 8.4–10.5)
Chloride: 104 mEq/L (ref 96–112)
Creatinine, Ser: 0.77 mg/dL (ref 0.40–1.20)
GFR: 81.99 mL/min (ref >60.00)
Glucose, Bld: 82 mg/dL (ref 70–99)
Potassium: 4.4 mEq/L (ref 3.5–5.1)
Sodium: 137 mEq/L (ref 135–145)
Total Bilirubin: 0.6 mg/dL (ref 0.2–1.2)
Total Protein: 7.3 g/dL (ref 6.0–8.3)

## 2020-02-17 LAB — LIPID PANEL
Cholesterol: 152 mg/dL (ref 0–200)
HDL: 38.9 mg/dL — ABNORMAL LOW (ref >39.00)
LDL Cholesterol: 101 mg/dL — ABNORMAL HIGH (ref 0–99)
NonHDL: 112.96
Total CHOL/HDL Ratio: 4
Triglycerides: 60 mg/dL (ref 0.0–149.0)
VLDL: 12 mg/dL (ref 0.0–40.0)

## 2020-02-17 LAB — TSH: TSH: 1.5 u[IU]/mL (ref 0.35–4.50)

## 2020-02-17 MED ORDER — CLINDAMYCIN PHOS-BENZOYL PEROX 1-5 % EX GEL: Freq: Two times a day (BID) | CUTANEOUS | 0 refills | Status: DC

## 2020-02-17 MED ORDER — TRAZODONE HCL 50 MG PO TABS: 25.0000 mg | ORAL_TABLET | Freq: Every evening | ORAL | 1 refills | Status: DC | PRN

## 2020-02-17 NOTE — Patient Instructions (Signed)
It was great to see you!  Trial the trazodone for sleep. Start with half tablet and next night may take full tablet. Follow-up with me if this does not help.  I have sent in topical treatment for your acne.  Please go to the lab for blood work.   Our office will call you with your results unless you have chosen to receive results via MyChart.  If your blood work is normal we will follow-up each year for physicals and as scheduled for chronic medical problems.  If anything is abnormal we will treat accordingly and get you in for a follow-up.  Take care,  [facility] Maintenance, Female Adopting a healthy lifestyle and getting preventive care are important in promoting health and wellness. Ask your health care provider about:  The right schedule for you to have regular tests and exams.  Things you can do on your own to prevent diseases and keep yourself healthy. What should I know about diet, weight, and exercise? Eat a healthy diet   Eat a diet that includes plenty of vegetables, fruits, low-fat dairy products, and lean protein.  Do not eat a lot of foods that are high in solid fats, added sugars, or sodium. Maintain a healthy weight Body mass index (BMI) is used to identify weight problems. It estimates body fat based on height and weight. Your health care provider can help determine your BMI and help you achieve or maintain a healthy weight. Get regular exercise Get regular exercise. This is one of the most important things you can do for your health. Most adults should:  Exercise for at least 150 minutes each week. The exercise should increase your heart rate and make you sweat (moderate-intensity exercise).  Do strengthening exercises at least twice a week. This is in addition to the moderate-intensity exercise.  Spend less time sitting. Even light physical activity can be beneficial. Watch cholesterol and blood lipids Have your blood tested for lipids and  cholesterol at 43 years of age, then have this test every 5 years. Have your cholesterol levels checked more often if:  Your lipid or cholesterol levels are high.  You are older than 43 years of age.  You are at high risk for heart disease. What should I know about cancer screening? Depending on your health history and family history, you may need to have cancer screening at various ages. This may include screening for:  Breast cancer.  Cervical cancer.  Colorectal cancer.  Skin cancer.  Lung cancer. What should I know about heart disease, diabetes, and high blood pressure? Blood pressure and heart disease  High blood pressure causes heart disease and increases the risk of stroke. This is more likely to develop in people who have high blood pressure readings, are of African descent, or are overweight.  Have your blood pressure checked: ? Every 3-5 years if you are 36-25 years of age. ? Every year if you are 34 years old or older. Diabetes Have regular diabetes screenings. This checks your fasting blood sugar level. Have the screening done:  Once every three years after age 58 if you are at a normal weight and have a low risk for diabetes.  More often and at a younger age if you are overweight or have a high risk for diabetes. What should I know about preventing infection? Hepatitis B If you have a higher risk for hepatitis B, you should be screened for this virus. Talk with your health care provider  to find out if you are at risk for hepatitis B infection. Hepatitis C Testing is recommended for:  Everyone born from [birth year] through 1964.  Anyone with known risk factors for hepatitis C. Sexually transmitted infections (STIs)  Get screened for STIs, including gonorrhea and chlamydia, if: ? You are sexually active and are younger than 43 years of age. ? You are older than 43 years of age and your health care provider tells you that you are at risk for this type of  infection. ? Your sexual activity has changed since you were last screened, and you are at increased risk for chlamydia or gonorrhea. Ask your health care provider if you are at risk.  Ask your health care provider about whether you are at high risk for HIV. Your health care provider may recommend a prescription medicine to help prevent HIV infection. If you choose to take medicine to prevent HIV, you should first get tested for HIV. You should then be tested every 3 months for as long as you are taking the medicine. Pregnancy  If you are about to stop having your period (premenopausal) and you may become pregnant, seek counseling before you get pregnant.  Take 400 to 800 micrograms (mcg) of folic acid every day if you become pregnant.  Ask for birth control (contraception) if you want to prevent pregnancy. Osteoporosis and menopause Osteoporosis is a disease in which the bones lose minerals and strength with aging. This can result in bone fractures. If you are 51 years old or older, or if you are at risk for osteoporosis and fractures, ask your health care provider if you should:  Be screened for bone loss.  Take a calcium or vitamin D supplement to lower your risk of fractures.  Be given hormone replacement therapy (HRT) to treat symptoms of menopause. Follow these instructions at home: Lifestyle  Do not use any products that contain nicotine or tobacco, such as cigarettes, e-cigarettes, and chewing tobacco. If you need help quitting, ask your health care provider.  Do not use street drugs.  Do not share needles.  Ask your health care provider for help if you need support or information about quitting drugs. Alcohol use  Do not drink alcohol if: ? Your health care provider tells you not to drink. ? You are pregnant, may be pregnant, or are planning to become pregnant.  If you drink alcohol: ? Limit how much you use to 0-1 drink a day. ? Limit intake if you are  breastfeeding.  Be aware of how much alcohol is in your drink. In the [place] one drink equals one 12 oz bottle of beer (355 mL), one 5 oz glass of wine (148 mL), or one 1 oz glass of hard liquor (44 mL). General instructions  Schedule regular health, dental, and eye exams.  Stay current with your vaccines.  Tell your health care provider if: ? You often feel depressed. ? You have ever been abused or do not feel safe at home. Summary  Adopting a healthy lifestyle and getting preventive care are important in promoting health and wellness.  Follow your health care provider's instructions about healthy diet, exercising, and getting tested or screened for diseases.  Follow your health care provider's instructions on monitoring your cholesterol and blood pressure. This information is not intended to replace advice given to you by your health care provider. Make sure you discuss any questions you have with your health care provider. Document Revised: 10/22/2018 Document Reviewed: 10/22/2018 Elsevier Patient  Education  [employer].

## 2020-02-17 NOTE — Progress Notes (Signed)
I acted as a Neurosurgeon for [employer], PA-C Corky Mull, LPN   Subjective:    Jacqueline Marshall is a 44 y.o. female and is here for a comprehensive physical exam.   HPI  Health Maintenance Due  Topic Date Due  . MAMMOGRAM  Never done  . PAP SMEAR-Modifier  Never done    Acute Concerns: Insomnia -- feels alert at night. Feels like she has difficulty shutting off. Has very interrupted sleep. We had prescribed trazodone and atarax in the past but she tried neither. She is ready to try medication. She has great sleep hygiene. Acne -- having worsening outbreak, not just in mask area. Uses sensitive skin products and hasn't tried anything specifically OTC.  Chronic Issues: Family hx of thyroid issues -- mom had her thyroid removed and is now on oral thyroid medication daily. Patient denies any specific symptom concerns today.  Health Maintenance: Immunizations -- UTD Colonoscopy -- N/A Mammogram -- overdue PAP -- UTD, done last year normal per pt will obtain results Bone Density -- N/A Diet -- struggles with emotional eating but Caffeine intake -- none Sleep habits -- see above Exercise -- working out regularly Weight -- Weight: 151 lb (68.5 kg)  Mood -- overall good Weight history: Wt Readings from Last 10 Encounters:  02/17/20 151 lb (68.5 kg)  01/12/19 191 lb (86.6 kg)  01/08/19 189 lb (85.7 kg)  01/06/19 190 lb 6.1 oz (86.4 kg)  09/10/18 189 lb (85.7 kg)   No LMP recorded. (Menstrual status: IUD). Period characteristics: none due to IUD Alcohol use: no concerns Tobacco use: none  Depression screen PHQ 2/9 02/17/2020  Decreased Interest 0  Down, Depressed, Hopeless 0  PHQ - 2 Score 0  Altered sleeping -  Tired, decreased energy -  Change in appetite -  Feeling bad or failure about yourself  -  Trouble concentrating -  Moving slowly or fidgety/restless -  Suicidal thoughts -  PHQ-9 Score -  Difficult doing work/chores -     Other  providers/specialists: Patient Care Team: Jarold Motto, [place] as PCP - General (Physician Assistant)    PMHx, SurgHx, SocialHx, Medications, and Allergies were reviewed in the Visit Navigator and updated as appropriate.   Past Medical History:  Diagnosis Date  . Depression    zoloft, wellbutrin -- did feel like she had some SAD while in [place]  . Hyperlipidemia    never on medications     Past Surgical History:  Procedure Laterality Date  . APPENDECTOMY    . CHOLECYSTECTOMY  2000     Family History  Problem Relation Age of Onset  . Cancer Mother        Breast  . Stroke Mother        several  . Thyroid disease Mother   . Hypertension Mother   . Cancer Maternal Grandfather        Lung  . Parkinson's disease Father        possible?  . Colon cancer Neg Hx     Social History   Tobacco Use  . Smoking status: Never Smoker  . Smokeless tobacco: Never Used  Substance Use Topics  . Alcohol use: Yes    Comment: socially  . Drug use: Never    Review of Systems:   Review of Systems  Constitutional: Negative for chills, fever, malaise/fatigue and weight loss.  HENT: Negative for hearing loss, sinus pain and sore throat.   Respiratory: Negative for cough and hemoptysis.  Cardiovascular: Negative for chest pain, palpitations, leg swelling and PND.  Gastrointestinal: Negative for abdominal pain, constipation, diarrhea, heartburn, nausea and vomiting.  Genitourinary: Negative for dysuria, frequency and urgency.  Musculoskeletal: Negative for back pain, myalgias and neck pain.  Skin: Negative for itching and rash.  Neurological: Negative for dizziness, tingling, seizures and headaches.  Endo/Heme/Allergies: Negative for polydipsia.  Psychiatric/Behavioral: Negative for depression. The patient has insomnia. The patient is not nervous/anxious.     Objective:   BP 124/74 (BP Location: Left Arm, Patient Position: Sitting, Cuff Size: Normal)   Pulse 68   Temp  98.4 F (36.9 C) (Temporal)   Ht 5\' 1"  (1.549 m)   Wt 151 lb (68.5 kg)   SpO2 99%   BMI 28.53 kg/m  Body mass index is 28.53 kg/m.   General Appearance:    Alert, cooperative, no distress, appears stated age  Head:    Normocephalic, without obvious abnormality, atraumatic  Eyes:    PERRL, conjunctiva/corneas clear, EOM's intact, fundi    benign, both eyes  Ears:    Normal TM's and external ear canals, both ears  Nose:   Nares normal, septum midline, mucosa normal, no drainage    or sinus tenderness  Throat:   Lips, mucosa, and tongue normal; teeth and gums normal  Neck:   Supple, symmetrical, trachea midline, no adenopathy;    thyroid:  no enlargement/tenderness/nodules; no carotid   bruit or JVD  Back:     Symmetric, no curvature, ROM normal, no CVA tenderness  Lungs:     Clear to auscultation bilaterally, respirations unlabored  Chest Wall:    No tenderness or deformity   Heart:    Regular rate and rhythm, S1 and S2 normal, no murmur, rub or gallop  Breast Exam:    Deferred  Abdomen:     Soft, non-tender, bowel sounds active all four quadrants,    no masses, no organomegaly  Genitalia:    Deferred  Extremities:   Extremities normal, atraumatic, no cyanosis or edema  Pulses:   2+ and symmetric all extremities  Skin:   Skin color, texture, turgor normal, no rashes  Scattered pustules on face   Lymph nodes:   Cervical, supraclavicular, and axillary nodes normal  Neurologic:   CNII-XII intact, normal strength, sensation and reflexes    throughout     Assessment/Plan:   Jacqueline Marshall was seen today for annual exam.  Diagnoses and all orders for this visit:  Encounter for general adult medical examination with abnormal findings Today patient counseled on age appropriate routine health concerns for screening and prevention, each reviewed and up to date or declined. Immunizations reviewed and up to date or declined. Labs ordered and reviewed. Risk factors for depression reviewed  and negative. Hearing function and visual acuity are intact. ADLs screened and addressed as needed. Functional ability and level of safety reviewed and appropriate. Education, counseling and referrals performed based on assessed risks today. Patient provided with a copy of personalized plan for preventive services.  Insomnia, unspecified type Sleep hygiene discussed. Will trial oral trazodone. Follow-up if no improvement. -     CBC with Differential/Platelet -     Comprehensive metabolic panel  Encounter for lipid screening for cardiovascular disease -     Lipid panel  Family history of thyroid disease Update TSH per patient request. She does take biotin supplements, so low threshold to recheck labs after holding supplement if labs are abnormal. -     TSH  Overweight Continue healthy diet  and lifestyle.  Acne vulgaris Will trial benzaclin topical treatment. Acne handout provided.  Other orders -     traZODone (DESYREL) 50 MG tablet; Take 0.5-1 tablets (25-50 mg total) by mouth at bedtime as needed for sleep. -     clindamycin-benzoyl peroxide (BENZACLIN) gel; Apply topically 2 (two) times daily.     Well Adult Exam: Labs ordered: Yes. Patient counseling was done. See below for items discussed. Discussed the patient's BMI.  The BMI is not in the acceptable range; BMI management plan is completed Follow up as needed for acute illness. Breast cancer screening: overdue, counseled. Cervical cancer screening: UTD   Patient Counseling: [x]    Nutrition: Stressed importance of moderation in sodium/caffeine intake, saturated fat and cholesterol, caloric balance, sufficient intake of fresh fruits, vegetables, fiber, calcium, iron, and 1 mg of folate supplement per day (for females capable of pregnancy).  [x]    Stressed the importance of regular exercise.   [x]    Substance Abuse: Discussed cessation/primary prevention of tobacco, alcohol, or other drug use; driving or other dangerous activities  under the influence; availability of treatment for abuse.   [x]    Injury prevention: Discussed safety belts, safety helmets, smoke detector, smoking near bedding or upholstery.   [x]    Sexuality: Discussed sexually transmitted diseases, partner selection, use of condoms, avoidance of unintended pregnancy  and contraceptive alternatives.  [x]    Dental health: Discussed importance of regular tooth brushing, flossing, and dental visits.  [x]    Health maintenance and immunizations reviewed. Please refer to Health maintenance section.   CMA or LPN served as scribe during this visit. History, Physical, and Plan performed by medical provider. The above documentation has been reviewed and is accurate and complete.   Inda Coke, PA-C [place]

## 2020-03-10 ENCOUNTER — Other Ambulatory Visit: Payer: Self-pay | Admitting: Physician Assistant

## 2020-03-10 NOTE — Telephone Encounter (Signed)
LAST APPOINTMENT DATE: 02/17/2020   NEXT APPOINTMENT DATE: Visit date not found  RX Trazodone  LAST REFILL: 02/17/2020  QTY: 30 Rf 1

## 2020-07-05 ENCOUNTER — Other Ambulatory Visit: Payer: Self-pay | Admitting: Physician Assistant

## 2020-07-13 ENCOUNTER — Ambulatory Visit (INDEPENDENT_AMBULATORY_CARE_PROVIDER_SITE_OTHER): Payer: 59 | Admitting: Physician Assistant

## 2020-07-13 ENCOUNTER — Encounter: Payer: Self-pay | Admitting: Physician Assistant

## 2020-07-13 ENCOUNTER — Other Ambulatory Visit: Payer: Self-pay

## 2020-07-13 ENCOUNTER — Ambulatory Visit (INDEPENDENT_AMBULATORY_CARE_PROVIDER_SITE_OTHER)
Admission: RE | Admit: 2020-07-13 | Discharge: 2020-07-13 | Disposition: A | Discharge status: Home / Self Care | Payer: 59 | Source: Ambulatory Visit | Attending: Physician Assistant | Admitting: Physician Assistant

## 2020-07-13 VITALS — BP 112/80 | HR 70 | Temp 97.2°F | Ht 61.0 in | Wt 157.2 lb

## 2020-07-13 DIAGNOSIS — M25559 Pain in unspecified hip: Secondary | ICD-10-CM

## 2020-07-13 DIAGNOSIS — M25552 Pain in left hip: Secondary | ICD-10-CM

## 2020-07-13 DIAGNOSIS — N898 Other specified noninflammatory disorders of vagina: Secondary | ICD-10-CM | POA: Diagnosis not present

## 2020-07-13 NOTE — Progress Notes (Signed)
Jacqueline Marshall is a 44 y.o. female here for a new problem.  I acted as a Neurosurgeon for [employer], PA-C Corky Mull, LPN   History of Present Illness:   Chief Complaint  Patient presents with  . Hip Pain  . Cyst    HPI   Hip pain Pt c/o left hip pain x 1 year, worse past 3 months. Has severe pain when she moves her hip out to the side and pain goes down her leg. Denies numbness or tingling. Has been doing yoga and this is not helping.  Cyst [facility] for UTI symptoms, UA and Culture Negative, STD testing Negative except for BV -- she was pt on antibiotics now. Pt has a cyst on inside of vagina that is painful. States that she may have had something similar in the past and thought she may have had a prolapse but it ended up being a UTI.   Past Medical History:  Diagnosis Date  . Depression    zoloft, wellbutrin -- did feel like she had some SAD while in [place]  . Hyperlipidemia    never on medications     Social History   Tobacco Use  . Smoking status: Never Smoker  . Smokeless tobacco: Never Used  Vaping Use  . Vaping Use: Never used  Substance Use Topics  . Alcohol use: Yes    Comment: socially  . Drug use: Never    Past Surgical History:  Procedure Laterality Date  . APPENDECTOMY    . CHOLECYSTECTOMY  2000    Family History  Problem Relation Age of Onset  . Cancer Mother        Breast  . Stroke Mother        several  . Thyroid disease Mother   . Hypertension Mother   . Cancer Maternal Grandfather        Lung  . Parkinson's disease Father        possible?  . Colon cancer Neg Hx     No Known Allergies  Current Medications:   Current Outpatient Medications:  .  BIOTIN PO, Take 1 capsule by mouth daily., Disp: , Rfl:  .  KRILL OIL PO, Take 1 capsule by mouth daily., Disp: , Rfl:  .  levonorgestrel (MIRENA, 52 MG,) 20 MCG/24HR IUD, 1 each by Intrauterine route once. Inserted by GYN about 2 years  ago in [facility], good for 5 years., Disp: , Rfl:  .  Misc Natural Products (AIRBORNE ELDERBERRY) CHEW, Chew 1 each by mouth daily., Disp: , Rfl:  .  Multiple Vitamins-Minerals (MULTIVITAMIN ADULTS PO), Take by mouth., Disp: , Rfl:  .  Probiotic Product (PROBIOTIC-10 PO), Take by mouth., Disp: , Rfl:  .  traZODone (DESYREL) 50 MG tablet, TAKE 1/2 TO 1 TABLET AT BEDTIME AS NEEDED SLEEP, Disp: 90 tablet, Rfl: 1 .  valACYclovir (VALTREX) 1000 MG tablet, Take two tablets ( total 2000 mg) by mouth q12h x 1 day; Start: ASAP after symptom onset, Disp: 6 tablet, Rfl: 2 .  vitamin B-12 (CYANOCOBALAMIN) 1000 MCG tablet, Take 1,000 mcg by mouth daily., Disp: , Rfl:  .  Vitamin D, Cholecalciferol, 25 MCG (1000 UT) CAPS, Take 1 capsule by mouth daily., Disp: , Rfl:  .  vitamin E (VITAMIN E) 180 MG (400 UNITS) capsule, Take 400 Units by mouth daily., Disp: , Rfl:  .  clotrimazole-betamethasone (LOTRISONE) cream, Apply topically 2 (two) times daily., Disp: , Rfl:  .  hydrOXYzine (ATARAX/VISTARIL) 25 MG tablet, Take one 25 mg tablet 30-60 minutes prior to bedtime for insomnia, anxiety. May increase to two tablets. (Patient not taking: Reported on 02/17/2020), Disp: 60 tablet, Rfl: 0 .  metroNIDAZOLE (FLAGYL) 500 MG tablet, SMARTSIG:1 Tablet(s) By Mouth Every 12 Hours, Disp: , Rfl:    Review of Systems:   ROS  Negative unless otherwise specified per HPI.  Vitals:   Vitals:   07/13/20 0737  BP: 112/80  Pulse: 70  Temp: (!) 97.2 F (36.2 C)  TempSrc: Temporal  SpO2: 99%  Weight: 157 lb 4 oz (71.3 kg)  Height: 5\' 1"  (1.549 m)     Body mass index is 29.71 kg/m.  Physical Exam:   Physical Exam Vitals and nursing note reviewed. Exam conducted with a chaperone present.  Constitutional:      General: She is not in acute distress.    Appearance: She is well-developed. She is not ill-appearing or toxic-appearing.  Cardiovascular:     Rate and Rhythm: Normal rate and regular rhythm.     Pulses:  Normal pulses.     Heart sounds: Normal heart sounds, S1 normal and S2 normal.     Comments: No LE edema Pulmonary:     Effort: Pulmonary effort is normal.     Breath sounds: Normal breath sounds.  Genitourinary:    Comments: Small cyst-like lesion at the vaginal wall opening at 12 o clock Musculoskeletal:     Comments: L hip: Decreased ROM with abduction due to pain Normal ROM with L leg flexion and extension Slight decreased ROM with external rotation due to pain  Skin:    General: Skin is warm and dry.  Neurological:     Mental Status: She is alert.     GCS: GCS eye subscore is 4. GCS verbal subscore is 5. GCS motor subscore is 6.  Psychiatric:        Speech: Speech normal.        Behavior: Behavior normal. Behavior is cooperative.       Assessment and Plan:   Jacqueline Marshall was seen today for hip pain and cyst.  Diagnoses and all orders for this visit:  Vaginal cyst Unclear etiology. Appointment make with Victorino Dike tomorrow AM for further evaluation. -     Ambulatory referral to Gynecology  Hip pain Possible hip flexor strain vs bursitis vs other. Will obtain xray to r/o acute concern. Likely PT vs sports medicine referral. -     DG HIP UNILAT W OR W/O PELVIS 2-3 VIEWS LEFT; Future  . Reviewed expectations re: course of current medical issues. . Discussed self-management of symptoms. . Outlined signs and symptoms indicating need for more acute intervention. . Patient verbalized understanding and all questions were answered. . See orders for this visit as documented in the electronic medical record. . Patient received an After-Visit Summary.  CMA or LPN served as scribe during this visit. History, Physical, and Plan performed by medical provider. The above documentation has been reviewed and is accurate and complete.   Gertie Exon, PA-C

## 2020-07-13 NOTE — Patient Instructions (Signed)
It was great to see you!  For your cyst Greta from [facility] will be calling you soon Dr. Emiliano Dyer office at [address], [place], [place] [address]  For your hip An order for an xray has been put in for you. To get your xray, you can walk in at the [facility] location without a scheduled appointment. The address is [address] It is across the street from [facility]. X-ray is located in the basement.  Hours of operation are M-F 8:30am to 5:00pm. Please note that they are closed for lunch between 12:30 and 1:00pm.  I will be in touch with the xray results and the plan.  Take care,  Jarold Motto PA-C

## 2020-07-14 ENCOUNTER — Ambulatory Visit (INDEPENDENT_AMBULATORY_CARE_PROVIDER_SITE_OTHER): Payer: 59 | Admitting: Obstetrics and Gynecology

## 2020-07-14 ENCOUNTER — Encounter: Payer: Self-pay | Admitting: Obstetrics and Gynecology

## 2020-07-14 VITALS — BP 108/64 | HR 73 | Ht 61.0 in | Wt 156.0 lb

## 2020-07-14 DIAGNOSIS — Z30431 Encounter for routine checking of intrauterine contraceptive device: Secondary | ICD-10-CM

## 2020-07-14 DIAGNOSIS — N368 Other specified disorders of urethra: Secondary | ICD-10-CM

## 2020-07-14 NOTE — Progress Notes (Signed)
44 y.o. [id number] Divorced Other or two or more races Hispanic or Latino female here for a consultation from [place], [place] for a painful vaginal cyst. Tender when she wipes and she says that it is getting worse. She  Had an X-ray of her hip and it incidentally showed that her "IUD was not in the right place".   She has a mirena IUD placed ~2 years, no regular cycles, just occasional spotting. She doesn't typically have pain with sex, 3 weeks ago when she had sex she had cramping and a lot of pain afterwards.  She was seen at [facility] 3 weeks ago for pain the pain and treated for BV. She had negative STD testing and a negative urine culture at the time.  2 weeks a "boil" started in her vagina, not draining, getting bigger, painful, it is at the center by the opening of the vagina. No urinary frequency, urgency to void or dysuria. She denies dribbling of urine.   She just had an XRay of her hip and was told her IUD may not be in place.    OB history, kids were born between 32-35 weeks  No LMP recorded (lmp unknown). (Menstrual status: IUD).          Sexually active: Yes.    The current method of family planning is IUD.    Exercising: Yes.    Cardio Smoker:  no  Health Maintenance: Pap:  [facility]  History of abnormal Pap:  no MMG:  2019 normal at [facility]  BMD:  No  Colonoscopy: no TDaP:  2012 Gardasil: no   reports that she has never smoked. She has never used smokeless tobacco. She reports current alcohol use. She reports that she does not use drugs.Kids range from 9-23.  Past Medical History:  Diagnosis Date  . Depression    zoloft, wellbutrin -- did feel like she had some SAD while in [place]  . Hyperlipidemia    never on medications    Past Surgical History:  Procedure Laterality Date  . APPENDECTOMY    . CHOLECYSTECTOMY  2000    Current Outpatient Medications  Medication Sig Dispense Refill  . BIOTIN PO Take 1 capsule by mouth  daily.    . clotrimazole-betamethasone (LOTRISONE) cream Apply topically 2 (two) times daily.    . hydrOXYzine (ATARAX/VISTARIL) 25 MG tablet Take one 25 mg tablet 30-60 minutes prior to bedtime for insomnia, anxiety. May increase to two tablets. 60 tablet 0  . KRILL OIL PO Take 1 capsule by mouth daily.    Marland Kitchen levonorgestrel (MIRENA, 52 MG,) 20 MCG/24HR IUD 1 each by Intrauterine route once. Inserted by GYN about 2 years ago in [facility], good for 5 years.    . Misc Natural Products (AIRBORNE ELDERBERRY) CHEW Chew 1 each by mouth daily.    . Multiple Vitamins-Minerals (MULTIVITAMIN ADULTS PO) Take by mouth.    . Probiotic Product (PROBIOTIC-10 PO) Take by mouth.    . traZODone (DESYREL) 50 MG tablet TAKE 1/2 TO 1 TABLET AT BEDTIME AS NEEDED SLEEP 90 tablet 1  . valACYclovir (VALTREX) 1000 MG tablet Take two tablets ( total 2000 mg) by mouth q12h x 1 day; Start: ASAP after symptom onset 6 tablet 2  . vitamin B-12 (CYANOCOBALAMIN) 1000 MCG tablet Take 1,000 mcg by mouth daily.    . Vitamin D, Cholecalciferol, 25 MCG (1000 UT) CAPS Take 1 capsule by mouth daily.    . vitamin E (VITAMIN  E) 180 MG (400 UNITS) capsule Take 400 Units by mouth daily.     No current facility-administered medications for this visit.    Family History  Problem Relation Age of Onset  . Cancer Mother        Breast  . Stroke Mother        several  . Thyroid disease Mother   . Hypertension Mother   . Cancer Maternal Grandfather        Lung  . Parkinson's disease Father        possible?  . Colon cancer Neg Hx     Review of Systems  Genitourinary: Positive for vaginal pain.  All other systems reviewed and are negative.   Exam:   BP 108/64   Pulse 73   Ht 5\' 1"  (1.549 m)   Wt 156 lb (70.8 kg)   LMP  (LMP Unknown)   SpO2 99%   BMI 29.48 kg/m   Weight change: @WEIGHTCHANGE @ Height:   Height: 5\' 1"  (154.9 cm)  Ht Readings from Last 3 Encounters:  07/14/20 5\' 1"  (1.549 m)  07/13/20 5\' 1"  (1.549 m)   02/17/20 5\' 1"  (1.549 m)    General appearance: alert, cooperative and appears stated age   Pelvic: External genitalia:  no lesions              Urethra:  normal appearing urethra meatus, there is a tender suburethral mass ~ 1 cm from the opening of the urethra. Appears cystic, no surrounding erythema. Under one cm.              Bartholins and Skenes: normal                 Vagina: normal appearing vagina with normal color and discharge, no lesions              Cervix: no lesions and IUD strings 3 cm               Bimanual Exam:  Uterus:  normal size, contour, position, consistency, mobility, non-tender, anteverted and uterus deviates to the patients left (explains Xray images).               Adnexa: no mass, fullness, tenderness                09/13/20 chaperoned for the exam.  X-Ray images reviewed the IUD in in the pelvis but deviated to the left, not in the midline.   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 No surrounding erythema.  IUD check, XRay with IUD off midline. Exam is normal, uterus deviates to the left.   P:   Recommend referral to Urology  She can use warm compress or soak in a warm tub   Reassured that her IUD appears to be in place.   CC: , PA Note sent

## 2020-08-09 ENCOUNTER — Ambulatory Visit (INDEPENDENT_AMBULATORY_CARE_PROVIDER_SITE_OTHER): Payer: 59 | Admitting: Physical Therapy

## 2020-08-09 ENCOUNTER — Other Ambulatory Visit: Payer: Self-pay

## 2020-08-09 ENCOUNTER — Encounter: Payer: Self-pay | Admitting: Physical Therapy

## 2020-08-09 DIAGNOSIS — M25552 Pain in left hip: Secondary | ICD-10-CM | POA: Diagnosis not present

## 2020-08-09 DIAGNOSIS — M25551 Pain in right hip: Secondary | ICD-10-CM | POA: Diagnosis not present

## 2020-08-11 ENCOUNTER — Encounter: Payer: Self-pay | Admitting: Physical Therapy

## 2020-08-11 NOTE — Therapy (Addendum)
[facility] [address] [place], [place], [address] Phone: [phone]   Fax:  [phone]  Physical Therapy Evaluation  Patient Details  Name: Syna Gad MRN: [id number] Date of Birth: [birth year] Referring Provider (PT): Inda Coke   Encounter Date: 08/09/2020   PT End of Session - 08/11/20 1157    Visit Number 1    Number of Visits 12    Date for PT Re-Evaluation 09/20/20    Authorization Type [employer]    PT Start Time 1749    PT Stop Time 1430    PT Time Calculation (min) 38 min    Activity Tolerance Patient tolerated treatment well    Behavior During Therapy [facility] for tasks assessed/performed           Past Medical History:  Diagnosis Date  . Depression    zoloft, wellbutrin -- did feel like she had some SAD while in [place]  . Hyperlipidemia    never on medications    Past Surgical History:  Procedure Laterality Date  . APPENDECTOMY    . CHOLECYSTECTOMY  2000    There were no vitals filed for this visit.    Subjective Assessment - 08/11/20 1154    Subjective Pt states increased pain in bil hips, L>R. No injurty to report. Sits for work, Radio broadcast assistant. Works out at gym, daily, elliptical, walks. No previous pain in hips. Does not do any strengthening . Denies back pain States significant pain anytime shes bringing leg out to the side, also when at gyno, in sturrups .    Limitations Lifting;Standing;Writing;House hold activities;Sitting    Patient Stated Goals decreased pain    Currently in Pain? Yes    Pain Score 8     Pain Location Hip    Pain Orientation Right;Left    Pain Descriptors / Indicators Aching    Pain Type Acute pain    Pain Onset More than a month ago    Pain Frequency Intermittent    Aggravating Factors  bringing leg out to side, abd or er              [facility] PT Assessment - 08/11/20 0001      Assessment   Medical Diagnosis Bil Hip pain    Referring Provider (PT)  Inda Coke    Prior Therapy no      Balance Screen   Has the patient fallen in the past 6 months No      Prior Function   Level of Independence Independent      Cognition   Overall Cognitive Status Within Functional Limits for tasks assessed      ROM / Strength   AROM / PROM / Strength AROM;Strength;PROM      AROM   Overall AROM Comments AROM WFL today, but states pain with ER and abd       PROM   Overall PROM Comments PROM bil hips: WNL       Strength   Overall Strength Comments Hips: 4-/5 to 4/5     Strength Assessment Site Hip      Palpation   Palpation comment Painful palpation of bil gr trochanter, into laterl thigh and ITB, minimal pain in back or glute      Special Tests   Other special tests Neg Corky Sox /Fadir                      Objective measurements completed on examination: See above findings.  [facility] PT Treatment/Exercise - 08/11/20 0001      Exercises   Exercises Knee/Hip      Knee/Hip Exercises: Stretches   ITB Stretch 2 reps;30 seconds    ITB Stretch Limitations supine with strap     Piriformis Stretch 3 reps;30 seconds    Piriformis Stretch Limitations supine fig 4       Knee/Hip Exercises: Sidelying   Hip ABduction 10 reps;Both    Clams x10 bil;                   PT Education - 08/11/20 1156    Education Details PT POC, exam findings.    Person(s) Educated Patient    Methods Explanation;Demonstration;Handout;Verbal cues    Comprehension Verbalized understanding;Returned demonstration;Verbal cues required;Need further instruction            PT Short Term Goals - 08/11/20 1200      PT SHORT TERM GOAL #1   Title Pt to be independent with initial HEP    Time 2    Period Weeks    Status New    Target Date 08/23/20             PT Long Term Goals - 08/11/20 1200      PT LONG TERM GOAL #1   Title Pt to be independent with final HEP    Time 6    Period Weeks    Status New    Target Date  09/20/20      PT LONG TERM GOAL #2   Title Pt to report decreased pain in bil hips to 0-1/10 with activity    Time 6    Period Weeks    Status New    Target Date 09/20/20      PT LONG TERM GOAL #3   Title Pt to report ability for at least 30 min of exercise with pain in hips no greater than 1-2/10    Time 6    Period Weeks    Status New    Target Date 09/20/20      PT LONG TERM GOAL #4   Title Pt to demo improved strength of bil hips to 5/5 to improve stability and pain    Time 6    Period Weeks    Status New    Target Date 09/20/20                  Plan - 08/11/20 1203    Clinical Impression Statement Pt presents with primary complaint of increased pain in bil hips, L>R. She has tenderness to palpate bil gr troch region in lateral hip and glute. Also has mild strength deficits, and lack of effective HEP. Pt with decreased ability for full functional activities due to pain. States pain with abduction motions, but is able to perform s/l hip abd against gravity very well today. Pt to benefit from skilled PT to improve deficits and pain.    Examination-Activity Limitations Locomotion Level;Sit;Squat;Stairs;Lift;Stand;Sleep    Examination-Participation Restrictions Cleaning;Occupation;Community Activity;Shop    Stability/Clinical Decision Making Stable/Uncomplicated    Clinical Decision Making Low    Rehab Potential Good    PT Frequency 2x / week    PT Duration 6 weeks    PT Treatment/Interventions ADLs/Self Care Home Management;Cryotherapy;Electrical Stimulation;Iontophoresis 64m/ml Dexamethasone;Moist Heat;Traction;Ultrasound;Gait training;DME Instruction;Neuromuscular re-education;Balance training;Therapeutic exercise;Therapeutic activities;Functional mobility training;Stair training;Patient/family education;Manual techniques;Taping;Passive range of motion;Dry needling;Splinting;Vasopneumatic Device;Spinal Manipulations;Joint Manipulations    Consulted and Agree with Plan of  Care Patient  Patient will benefit from skilled therapeutic intervention in order to improve the following deficits and impairments:  Decreased range of motion, Increased muscle spasms, Pain, Decreased activity tolerance, Decreased balance, Impaired flexibility, Decreased strength, Decreased mobility  Visit Diagnosis: Pain in left hip  Pain in right hip     Problem List There are no problems to display for this patient.   Lyndee Hensen, PT, DPT 12:13 PM  08/11/20    [facility] [place] [facility] [place] [address], [place], [address] Phone: [phone]   Fax:  [phone]  Name: Latera Mclin MRN: [id number] Date of Birth: 1976-01-30   PHYSICAL THERAPY DISCHARGE SUMMARY  Visits from Start of Care: 1 Plan: Patient agrees to discharge.  Patient goals were partially met. Patient is being discharged due to not returning since the last visit.  ?????     Lyndee Hensen, PT, DPT 2:20 PM  04/03/21

## 2020-08-22 ENCOUNTER — Other Ambulatory Visit: Payer: Self-pay | Admitting: Physician Assistant

## 2020-08-23 ENCOUNTER — Other Ambulatory Visit: Payer: Self-pay

## 2020-08-23 ENCOUNTER — Encounter: Payer: 59 | Admitting: Physical Therapy

## 2020-08-23 ENCOUNTER — Encounter: Payer: Self-pay | Admitting: Physician Assistant

## 2020-08-23 MED ORDER — VALACYCLOVIR HCL 1 G PO TABS: ORAL_TABLET | ORAL | 2 refills | Status: DC

## 2020-08-23 MED ORDER — VALACYCLOVIR HCL 1 G PO TABS: ORAL_TABLET | ORAL | 2 refills | Status: AC

## 2022-03-07 ENCOUNTER — Ambulatory Visit (INDEPENDENT_AMBULATORY_CARE_PROVIDER_SITE_OTHER): Payer: 59 | Admitting: Physician Assistant

## 2022-03-07 ENCOUNTER — Encounter: Payer: Self-pay | Admitting: Physician Assistant

## 2022-03-07 VITALS — BP 110/80 | HR 86 | Temp 98.1°F | Ht 61.0 in | Wt 191.4 lb

## 2022-03-07 DIAGNOSIS — R1084 Generalized abdominal pain: Secondary | ICD-10-CM | POA: Diagnosis not present

## 2022-03-07 DIAGNOSIS — R197 Diarrhea, unspecified: Secondary | ICD-10-CM | POA: Diagnosis not present

## 2022-03-07 LAB — POCT URINE PREGNANCY: Preg Test, Ur: NEGATIVE

## 2022-03-07 NOTE — Patient Instructions (Signed)
It was great to see you! ? ?Clear liquid diet for now ?CT scan will be scheduled for tomorrow morning ? ?Do not drink contrast until you are advised to do so ? ?If any worsening pain, go to the ER ? ?Take care, ? ?Inda Coke PA-C  ?

## 2022-03-07 NOTE — Progress Notes (Signed)
Jacqueline Marshall is a 46 y.o. female here for diarrhea. ? ?History of Present Illness:  ? ?Chief Complaint  ?Patient presents with  ? Diarrhea  ?  Pt c/o very loose stools x 3 days, going 12-15 times a day. Pt was on antibiotics recently for sinus infection   ? ? ?HPI ? ?Diarrhea ?Jacqueline Marshall presents with c/o diarrhea that has been onset for 3 days. States that she has been having at least 12-15 bowel movements that have been very loose. According to pt, her worse sx is the abdominal pain she feels following eating only to go to the restroom and not have much of a BM. In an effort to manage her sx, she has tried pepto bismol and metamucil but this only provided temporary relief. Upon reviewing the [facility] stool chart , her current stool is type 5-type 6 while it is usually type 3. Pt was recently taking amoxicillin due to having a bilateral ear infection, but stopped taking this Monday due to GI upset. While she believes this could have contributed to her diarrhea, she also believes that some sausage balls she made for breakfast prior to this could be a cause as well.  ? ?Denies hematochezia, nausea, prior constipation, vomiting, fever, or chills.  ? ? ? ?Past Medical History:  ?Diagnosis Date  ? Depression   ? zoloft, wellbutrin -- did feel like she had some SAD while in [place]  ? Hyperlipidemia   ? never on medications  ? ?  ?Social History  ? ?Tobacco Use  ? Smoking status: Never  ? Smokeless tobacco: Never  ?Vaping Use  ? Vaping Use: Never used  ?Substance Use Topics  ? Alcohol use: Yes  ?  Comment: socially  ? Drug use: Never  ? ? ?Past Surgical History:  ?Procedure Laterality Date  ? APPENDECTOMY    ? CHOLECYSTECTOMY  2000  ? ? ?Family History  ?Problem Relation Age of Onset  ? Cancer Mother   ?     Breast  ? Stroke Mother   ?     several  ? Thyroid disease Mother   ? Hypertension Mother   ? Cancer Maternal Grandfather   ?     Lung  ? Parkinson's disease Father   ?     possible?  ? Colon cancer Neg Hx    ? ? ?No Known Allergies ? ?Current Medications:  ? ?Current Outpatient Medications:  ?  hydrOXYzine (ATARAX/VISTARIL) 25 MG tablet, Take one 25 mg tablet 30-60 minutes prior to bedtime for insomnia, anxiety. May increase to two tablets., Disp: 60 tablet, Rfl: 0 ?  levonorgestrel (MIRENA) 20 MCG/24HR IUD, 1 each by Intrauterine route once. Inserted by GYN about 2 years ago in [facility], good for 5 years., Disp: , Rfl:  ?  Misc Natural Products (AIRBORNE ELDERBERRY) CHEW, Chew 1 each by mouth daily., Disp: , Rfl:  ?  Multiple Vitamins-Minerals (MULTIVITAMIN ADULTS PO), Take by mouth., Disp: , Rfl:  ?  traZODone (DESYREL) 50 MG tablet, TAKE 1/2 TO 1 TABLET AT BEDTIME AS NEEDED SLEEP, Disp: 90 tablet, Rfl: 1 ?  valACYclovir (VALTREX) 1000 MG tablet, TAKE 2 TABLETS BY MOUTH EVERY 12 HOURS X1 DAY, Disp: 6 tablet, Rfl: 2 ?  clotrimazole-betamethasone (LOTRISONE) cream, Apply topically 2 (two) times daily. (Patient not taking: Reported on 03/07/2022), Disp: , Rfl:   ? ?Review of Systems:  ? ?ROS ?Negative unless otherwise specified per HPI. ?Vitals:  ? ?Vitals:  ? 03/07/22 1546  ?BP: 110/80  ?  Pulse: 86  ?Temp: 98.1 ?F (36.7 ?C)  ?TempSrc: Temporal  ?SpO2: 98%  ?Weight: 191 lb 6.1 oz (86.8 kg)  ?Height: 5\' 1"  (1.549 m)  ?   ?Body mass index is 36.16 kg/m?. ? ?Physical Exam:  ? ?Physical Exam ?Vitals and nursing note reviewed.  ?Constitutional:   ?   General: She is not in acute distress. ?   Appearance: She is well-developed. She is not ill-appearing or toxic-appearing.  ?Cardiovascular:  ?   Rate and Rhythm: Normal rate and regular rhythm.  ?   Pulses: Normal pulses.  ?   Heart sounds: Normal heart sounds, S1 normal and S2 normal.  ?Pulmonary:  ?   Effort: Pulmonary effort is normal.  ?   Breath sounds: Normal breath sounds.  ?Abdominal:  ?   General: Abdomen is flat.  ?   Palpations: Abdomen is soft.  ?   Tenderness: There is abdominal tenderness in the periumbilical area and left lower quadrant.  ?Skin: ?   General:  Skin is warm and dry.  ?Neurological:  ?   Mental Status: She is alert.  ?   GCS: GCS eye subscore is 4. GCS verbal subscore is 5. GCS motor subscore is 6.  ?Psychiatric:     ?   Speech: Speech normal.     ?   Behavior: Behavior normal. Behavior is cooperative.  ? ? ?Assessment and Plan:  ? ?Diarrhea, unspecified type; Abdominal Pain ?Uncontrolled ?She is overall well hydrated and vitals are stable. ?Update labs and ordered CT of abdomen for further evaluation  ?Ddx includes: gastroenteritis, colitis, diverticulitis, c diff, among others ?Encouraged patient to work on consuming only clear liquids at this time ?If worsening symptoms -- needs to go to the ER ? ?I,Havlyn C Ratchford,acting as a scribe for , PA.,have documented all relevant documentation on the behalf of [employer], PA,as directed by  Jarold Motto, PA while in the presence of Jarold Motto, Jarold Motto. ? ?[place], PA, have reviewed all documentation for this visit. The documentation on 03/07/22 for the exam, diagnosis, procedures, and orders are all accurate and complete. ? ? ?03/09/22, PA-C ? ?

## 2022-03-08 LAB — COMPREHENSIVE METABOLIC PANEL
ALT: 11 U/L (ref 0–35)
AST: 15 U/L (ref 0–37)
Albumin: 4 g/dL (ref 3.5–5.2)
Alkaline Phosphatase: 61 U/L (ref 39–117)
BUN: 9 mg/dL (ref 6–23)
CO2: 27 mEq/L (ref 19–32)
Calcium: 8.5 mg/dL (ref 8.4–10.5)
Chloride: 100 mEq/L (ref 96–112)
Creatinine, Ser: 0.74 mg/dL (ref 0.40–1.20)
GFR: 98.27 mL/min (ref >60.00)
Glucose, Bld: 104 mg/dL — ABNORMAL HIGH (ref 70–99)
Potassium: 4.5 mEq/L (ref 3.5–5.1)
Sodium: 133 mEq/L — ABNORMAL LOW (ref 135–145)
Total Bilirubin: 0.3 mg/dL (ref 0.2–1.2)
Total Protein: 7.5 g/dL (ref 6.0–8.3)

## 2022-03-08 LAB — CBC WITH DIFFERENTIAL/PLATELET
Basophils Absolute: 0.1 10*3/uL (ref 0.0–0.1)
Basophils Relative: 0.9 % (ref 0.0–3.0)
Eosinophils Absolute: 0.1 10*3/uL (ref 0.0–0.7)
Eosinophils Relative: 1.1 % (ref 0.0–5.0)
HCT: 36.3 % (ref 36.0–46.0)
Hemoglobin: 11.7 g/dL — ABNORMAL LOW (ref 12.0–15.0)
Lymphocytes Relative: 17.1 % (ref 12.0–46.0)
Lymphs Abs: 2.1 10*3/uL (ref 0.7–4.0)
MCHC: 32.3 g/dL (ref 30.0–36.0)
MCV: 84.4 fl (ref 78.0–100.0)
Monocytes Absolute: 1 10*3/uL (ref 0.1–1.0)
Monocytes Relative: 7.6 % (ref 3.0–12.0)
Neutro Abs: 9.2 10*3/uL — ABNORMAL HIGH (ref 1.4–7.7)
Neutrophils Relative %: 73.3 % (ref 43.0–77.0)
Platelets: 315 10*3/uL (ref 150.0–400.0)
RBC: 4.3 Mil/uL (ref 3.87–5.11)
RDW: 13.9 % (ref 11.5–15.5)
WBC: 12.5 10*3/uL — ABNORMAL HIGH (ref 4.0–10.5)

## 2022-03-08 LAB — LIPASE: Lipase: 52 U/L (ref 11.0–59.0)

## 2022-03-10 ENCOUNTER — Encounter: Payer: Self-pay | Admitting: Physician Assistant

## 2022-03-12 ENCOUNTER — Emergency Department (HOSPITAL_BASED_OUTPATIENT_CLINIC_OR_DEPARTMENT_OTHER)
Admission: EM | Admit: 2022-03-12 | Discharge: 2022-03-12 | Disposition: A | Discharge status: Home / Self Care | Payer: 59 | Attending: Emergency Medicine | Admitting: Emergency Medicine

## 2022-03-12 ENCOUNTER — Other Ambulatory Visit: Payer: Self-pay

## 2022-03-12 ENCOUNTER — Emergency Department (HOSPITAL_BASED_OUTPATIENT_CLINIC_OR_DEPARTMENT_OTHER): Payer: 59

## 2022-03-12 ENCOUNTER — Encounter (HOSPITAL_BASED_OUTPATIENT_CLINIC_OR_DEPARTMENT_OTHER): Payer: Self-pay | Admitting: Obstetrics and Gynecology

## 2022-03-12 DIAGNOSIS — R197 Diarrhea, unspecified: Secondary | ICD-10-CM

## 2022-03-12 DIAGNOSIS — R112 Nausea with vomiting, unspecified: Secondary | ICD-10-CM

## 2022-03-12 DIAGNOSIS — R1032 Left lower quadrant pain: Secondary | ICD-10-CM | POA: Diagnosis not present

## 2022-03-12 LAB — COMPREHENSIVE METABOLIC PANEL
ALT: 10 U/L (ref 0–44)
AST: 18 U/L (ref 15–41)
Albumin: 3.8 g/dL (ref 3.5–5.0)
Alkaline Phosphatase: 53 U/L (ref 38–126)
Anion gap: 7 (ref 5–15)
BUN: 14 mg/dL (ref 6–20)
CO2: 25 mmol/L (ref 22–32)
Calcium: 9.2 mg/dL (ref 8.9–10.3)
Chloride: 104 mmol/L (ref 98–111)
Creatinine, Ser: 1.87 mg/dL — ABNORMAL HIGH (ref 0.44–1.00)
GFR, Estimated: 34 mL/min — ABNORMAL LOW (ref >60)
Glucose, Bld: 92 mg/dL (ref 70–99)
Potassium: 4.1 mmol/L (ref 3.5–5.1)
Sodium: 136 mmol/L (ref 135–145)
Total Bilirubin: 0.4 mg/dL (ref 0.3–1.2)
Total Protein: 7.7 g/dL (ref 6.5–8.1)

## 2022-03-12 LAB — URINALYSIS, ROUTINE W REFLEX MICROSCOPIC
Bilirubin Urine: NEGATIVE
Glucose, UA: NEGATIVE mg/dL
Ketones, ur: NEGATIVE mg/dL
Leukocytes,Ua: NEGATIVE
Nitrite: NEGATIVE
Protein, ur: NEGATIVE mg/dL
Specific Gravity, Urine: 1.011 (ref 1.005–1.030)
pH: 5 (ref 5.0–8.0)

## 2022-03-12 LAB — CBC
HCT: 35.4 % — ABNORMAL LOW (ref 36.0–46.0)
Hemoglobin: 11.4 g/dL — ABNORMAL LOW (ref 12.0–15.0)
MCH: 26.7 pg (ref 26.0–34.0)
MCHC: 32.2 g/dL (ref 30.0–36.0)
MCV: 82.9 fL (ref 80.0–100.0)
Platelets: 336 10*3/uL (ref 150–400)
RBC: 4.27 MIL/uL (ref 3.87–5.11)
RDW: 13.7 % (ref 11.5–15.5)
WBC: 8.2 10*3/uL (ref 4.0–10.5)
nRBC: 0 % (ref 0.0–0.2)

## 2022-03-12 LAB — PREGNANCY, URINE: Preg Test, Ur: NEGATIVE

## 2022-03-12 LAB — LIPASE, BLOOD: Lipase: 35 U/L (ref 11–51)

## 2022-03-12 MED ORDER — SODIUM CHLORIDE 0.9 % IV BOLUS
1000.0000 mL | Freq: Once | INTRAVENOUS | Status: AC
  Administered 2022-03-12: 1000 mL via INTRAVENOUS

## 2022-03-12 MED ORDER — IOHEXOL 9 MG/ML PO SOLN
500.0000 mL | ORAL | Status: AC
  Administered 2022-03-12 (×2): 500 mL via ORAL

## 2022-03-12 MED ORDER — ONDANSETRON HCL 4 MG/2ML IJ SOLN
4.0000 mg | Freq: Once | INTRAMUSCULAR | Status: AC | PRN
  Administered 2022-03-12: 4 mg via INTRAVENOUS
  Filled 2022-03-12: qty 2

## 2022-03-12 MED ORDER — SODIUM CHLORIDE 0.9 % IV BOLUS: 1000.0000 mL | Freq: Once | INTRAVENOUS | Status: DC

## 2022-03-12 NOTE — ED Notes (Signed)
Pt d/c home per MD order. Discharge summary reviewed, pt verbalizes understanding. Ambulatory. No s/s of acute distress noted.  

## 2022-03-12 NOTE — Telephone Encounter (Signed)
Please schedule CT scan orders are in. ?

## 2022-03-12 NOTE — ED Triage Notes (Signed)
Patient reports to the ER for abdominal pain. Patient endorses nausea without emesis. Endorses diarrhea.  ?

## 2022-03-12 NOTE — ED Provider Notes (Signed)
[facility] EMERGENCY DEPT Provider Note   CSN: [id number] Arrival date & time: 03/12/22  1024     History  Chief Complaint  Patient presents with   Abdominal Pain    Jacqueline Marshall is a 46 y.o. female.  Patient presents to the emergency department complaining of lower quadrant abdominal pain, diarrhea, and nausea.  Patient states that symptoms first began after she was given a prescription between a week and 10 days ago for Augmentin.  Patient at that time had been diagnosed with otitis media.  She began to have upset stomach with loose stools.  She saw a family medicine doctor who prescribed her Flagyl and Cipro with concerns for possible diverticulitis.  An outpatient CT scan was ordered at that visit.  Since taking the Cipro and Flagyl the patient has had severe nausea with vomiting.  She stopped taking both Flagyl and Cipro on Friday.  She took neither on Saturday.  Yesterday she resumed Cipro.  States she did not get the CT due to the CT scan being down at the time and was told to go to the emergency department for CT scan.  The patient was also prescribed Zofran which has helped with the emesis.  She states that for the past few days she has been able to drink water and keep down applesauce but no solid foods.  Denies chest pain, shortness of breath.  Past medical history significant for depression, hyperlipidemia, appendectomy, cholecystectomy  HPI     Home Medications Prior to Admission medications   Medication Sig Start Date End Date Taking? Authorizing Provider  clotrimazole-betamethasone (LOTRISONE) cream Apply topically 2 (two) times daily. Patient not taking: Reported on 03/07/2022 07/05/20   [provider]  hydrOXYzine (ATARAX/VISTARIL) 25 MG tablet Take one 25 mg tablet 30-60 minutes prior to bedtime for insomnia, anxiety. May increase to two tablets. 01/08/19   Inda Coke, PA  levonorgestrel (MIRENA) 20 MCG/24HR IUD 1 each by Intrauterine  route once. Inserted by GYN about 2 years ago in [facility], good for 5 years.    [provider]  Misc Natural Products (AIRBORNE ELDERBERRY) CHEW Chew 1 each by mouth daily.    [provider]  Multiple Vitamins-Minerals (MULTIVITAMIN ADULTS PO) Take by mouth.    [provider]  traZODone (DESYREL) 50 MG tablet TAKE 1/2 TO 1 TABLET AT BEDTIME AS NEEDED SLEEP 03/10/20   Inda Coke, PA  valACYclovir (VALTREX) 1000 MG tablet TAKE 2 TABLETS BY MOUTH EVERY 12 HOURS X1 DAY 08/23/20   Inda Coke, PA      Allergies    Patient has no known allergies.    Review of Systems   Review of Systems  Constitutional:  Negative for fever.  Respiratory:  Negative for shortness of breath.   Cardiovascular:  Negative for chest pain.  Gastrointestinal:  Positive for abdominal pain, diarrhea, nausea and vomiting. Negative for blood in stool.  Genitourinary:  Negative for dysuria.   Physical Exam Updated Vital Signs BP 129/69   Pulse 70   Temp 98.6 F (37 C)   Resp 16   SpO2 100%  Physical Exam Vitals and nursing note reviewed.  Constitutional:      General: She is not in acute distress. HENT:     Head: Normocephalic and atraumatic.  Eyes:     Extraocular Movements: Extraocular movements intact.  Cardiovascular:     Rate and Rhythm: Normal rate and regular rhythm.     Heart sounds: Normal heart sounds.  Pulmonary:  Effort: Pulmonary effort is normal.     Breath sounds: Normal breath sounds.  Abdominal:     General: Abdomen is flat. Bowel sounds are normal. There is no distension.     Palpations: Abdomen is soft.     Tenderness: There is abdominal tenderness (Mild LLQ tenderness) in the left lower quadrant.  Skin:    General: Skin is warm and dry.  Neurological:     Mental Status: She is alert and oriented to person, place, and time.    ED Results / Procedures / Treatments   Labs (all labs ordered are listed, but only abnormal results are  displayed) Labs Reviewed  COMPREHENSIVE METABOLIC PANEL - Abnormal; Notable for the following components:      Result Value   Creatinine, Ser 1.87 (*)    GFR, Estimated 34 (*)    All other components within normal limits  CBC - Abnormal; Notable for the following components:   Hemoglobin 11.4 (*)    HCT 35.4 (*)    All other components within normal limits  URINALYSIS, ROUTINE W REFLEX MICROSCOPIC - Abnormal; Notable for the following components:   Hgb urine dipstick SMALL (*)    Bacteria, UA FEW (*)    All other components within normal limits  LIPASE, BLOOD  PREGNANCY, URINE    EKG None  Radiology CT ABDOMEN PELVIS WO CONTRAST  Result Date: 03/12/2022 CLINICAL DATA:  LLQ abdominal pain LLQ pain, possible diverticulitis, GFR low due to AKI so avoiding IV contrast EXAM: CT ABDOMEN AND PELVIS WITHOUT CONTRAST TECHNIQUE: Multidetector CT imaging of the abdomen and pelvis was performed following the standard protocol without IV contrast. RADIATION DOSE REDUCTION: This exam was performed according to the departmental dose-optimization program which includes automated exposure control, adjustment of the mA and/or kV according to patient size and/or use of iterative reconstruction technique. COMPARISON:  None. FINDINGS: Lower chest: No acute abnormality. Hepatobiliary: No focal liver abnormality is seen. Prior cholecystectomy. Pancreas: Unremarkable. No pancreatic ductal dilatation or surrounding inflammatory changes. Spleen: Normal in size without focal abnormality. Adrenals/Urinary Tract: Adrenal glands are unremarkable. No hydronephrosis or nephrolithiasis. Bladder is moderately distended. Stomach/Bowel: The stomach is within normal limits. There is no evidence of bowel obstruction.Prior appendectomy. No acute inflammatory process involving the bowel. Vascular/Lymphatic: No significant vascular findings are present. No enlarged abdominal or pelvic lymph nodes. Reproductive: IUD is in place.  Other: No abdominal wall hernia or abnormality. Trace nonspecific free fluid in the pelvis, likely physiologic. Musculoskeletal: No acute or significant osseous findings. IMPRESSION: No acute findings in the abdomen or pelvis. Electronically Signed   By: Caprice Renshaw M.D.   On: 03/12/2022 15:14    Procedures Procedures   Medications Ordered in ED Medications  sodium chloride 0.9 % bolus 1,000 mL (1,000 mLs Intravenous New Bag/Given 03/12/22 1317)  ondansetron (ZOFRAN) injection 4 mg (4 mg Intravenous Given 03/12/22 1318)  iohexol (OMNIPAQUE) 9 MG/ML oral solution 500 mL (500 mLs Oral Contrast Given 03/12/22 1349)    ED Course/ Medical Decision Making/ A&P                           Medical Decision Making Amount and/or Complexity of Data Reviewed Labs: ordered. Radiology: ordered.  Risk Prescription drug management.   This patient presents to the ED for concern of nausea and diarrhea, this involves an extensive number of treatment options, and is a complaint that carries with it a high risk of complications and morbidity.  The differential diagnosis includes reaction to antibiotics, viral infection, gastritis, diverticulitis, and others   Co morbidities that complicate the patient evaluation  None   Additional history obtained:   External records from outside source obtained and reviewed including primary care and urgent care notes from the week of 4/23   Lab Tests:  I Ordered, and personally interpreted labs.  The pertinent results include: Creatinine 1.87, hemoglobin 11.4, negative pregnancy test, small hemoglobin on urine dipstick, few bacteria in UA   Imaging Studies ordered:  I ordered imaging studies including CT abdomen without IV contrast, with oral contrast I independently visualized and interpreted imaging which showed no acute findings I agree with the radiologist interpretation   Problem List / ED Course / Critical interventions / Medication management   I  ordered medication including Zofran for nausea, saline for AKI Reevaluation of the patient after these medicines showed that the patient improved I have reviewed the patients home medicines and have made adjustments as needed   Test / Admission - Considered:  She presented with a chief concern of nausea and diarrhea, with concerns about possible diverticulitis based on other providers differential diagnosis.  Due to left lower quadrant pain CT was ordered which showed no acute findings.  All GI symptoms began after the patient started Augmentin.  They seem to have worsened after the Flagyl was started.  I believe the patient is having an adverse reaction to the medications.  There are no signs of diverticulitis on CT.  The patient does have an elevated creatinine showing an acute kidney injury on her labs today.  I gave the patient 2 L of saline today.  Recommend she follow-up with her primary care in 3 days for repeat labs to check on resolution of AKI.  This could be due to recent volume loss from both vomiting and diarrhea.  I see no reason for further work-up at the emergent level today.  Recommend discharge home with PCP follow-up in 3 days.   Final Clinical Impression(s) / ED Diagnoses Final diagnoses:  Nausea and vomiting, unspecified vomiting type  Diarrhea, unspecified type    Rx / DC Orders ED Discharge Orders     None         Ronny Bacon 03/12/22 1617    Luna Fuse, MD 03/30/22 2157

## 2022-03-12 NOTE — ED Notes (Signed)
Pt given a UA cup and taken to restroom  ?

## 2022-03-12 NOTE — Discharge Instructions (Signed)
You were seen today for nausea, vomiting, and diarrhea.  This may be related to your antibiotic usage.  All symptoms seem to be correlated in time to the beginning of your antibiotic therapies.  Your CT today shows no acute findings in the abdomen or pelvis.  I recommend follow-up with your primary care provider within 3 days for repeat labs to confirm that your kidney function has returned to baseline.  Continue to use Zofran as needed ?

## 2022-11-30 IMAGING — CT CT ABD-PELV W/O CM
2 of 4 series · 17 of 46 positions shown, 19 images · non-contrast
Comparison: None.

CLINICAL DATA: LLQ abdominal pain LLQ pain, possible
diverticulitis, GFR low due to [REDACTED] avoiding IV contrast



[Series 2: abd pel wo · axial · 0.74mm/px · z∈[-21,+384]mm · 14 of 89 slices shown, 16 images]
[im 4/89  soft-tissue]
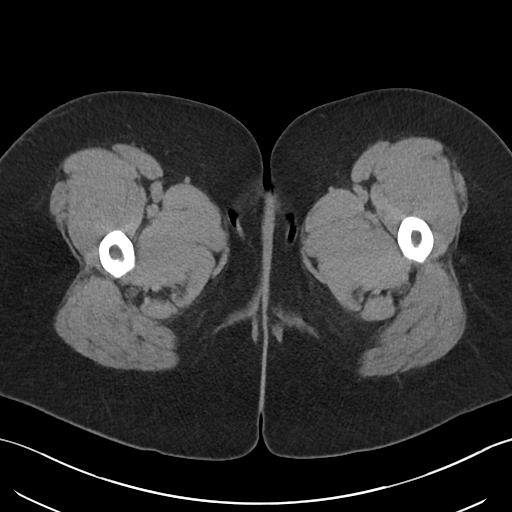
[im 4/89  bone]
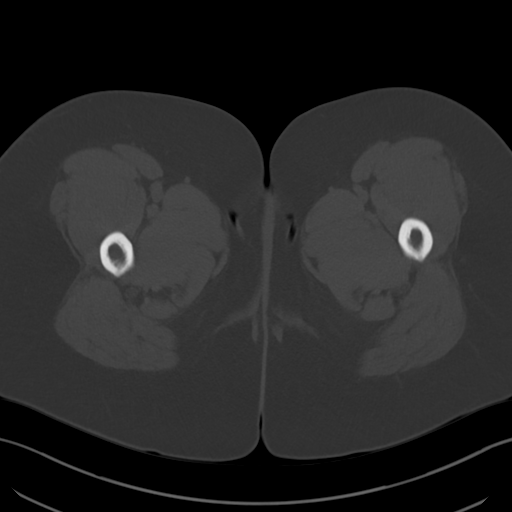
[im 11/89  soft-tissue]
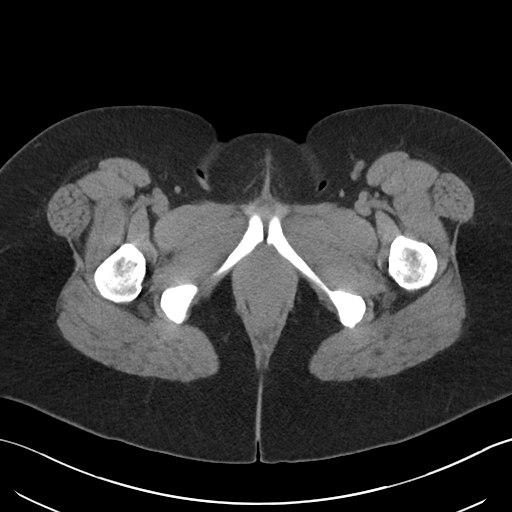
[im 18/89  soft-tissue]
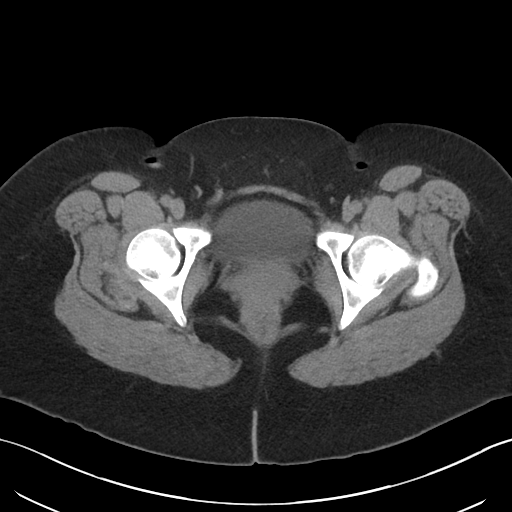
[im 25/89  soft-tissue]
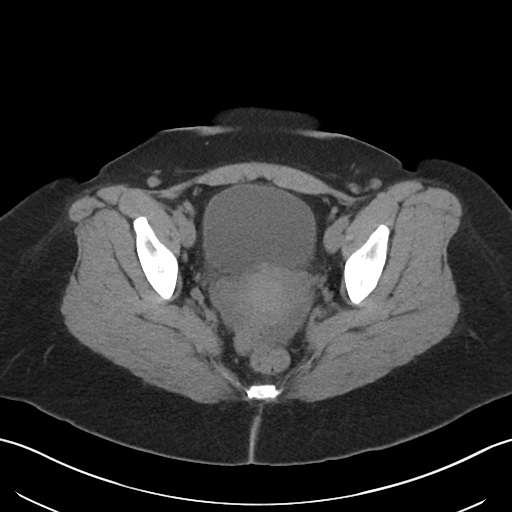
[im 29/89  soft-tissue]
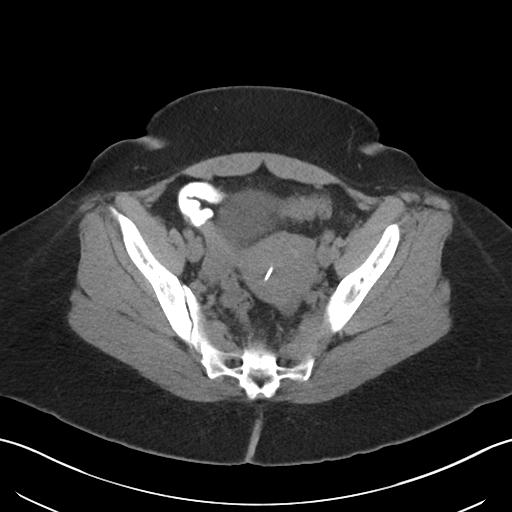
[im 36/89  soft-tissue]
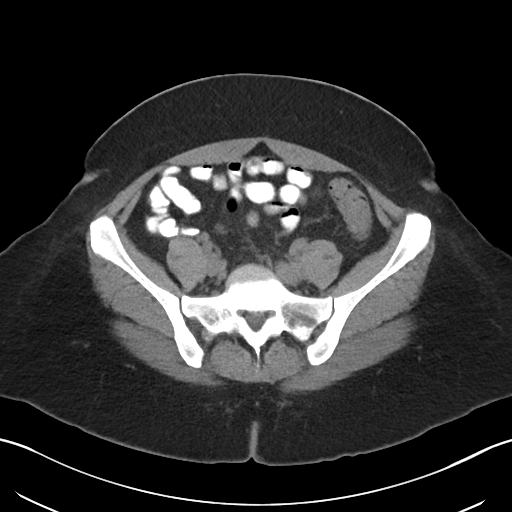
[im 43/89  soft-tissue]
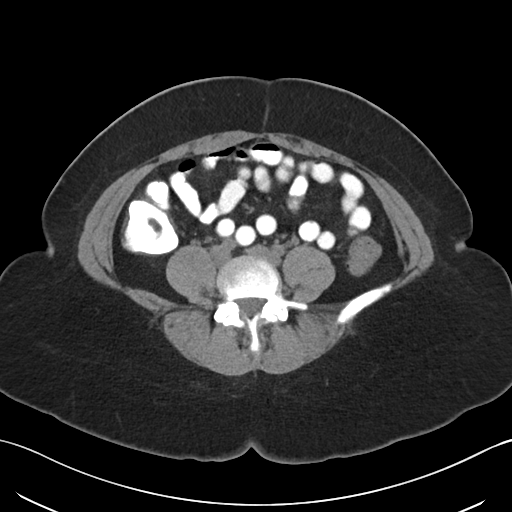
[im 46/89  soft-tissue]
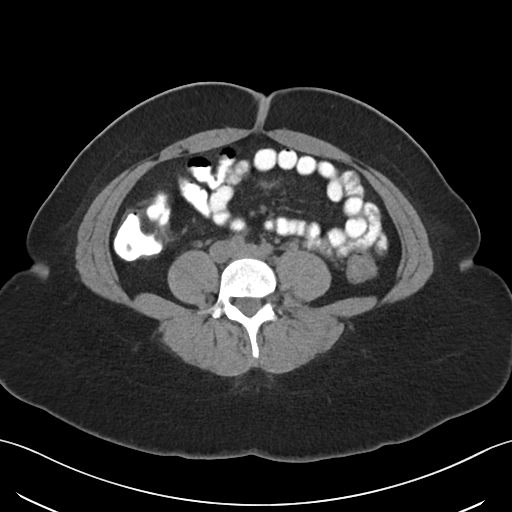
[im 53/89  soft-tissue]
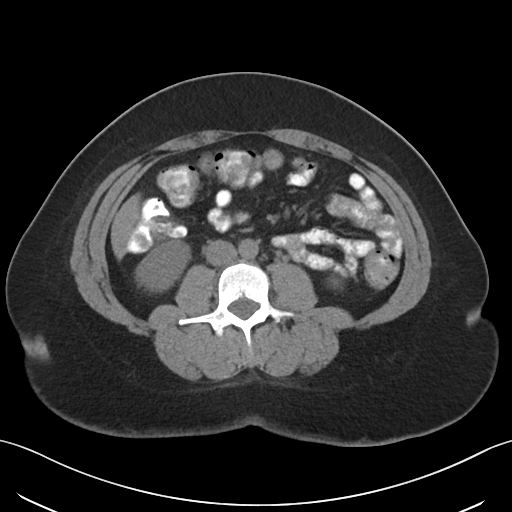
[im 53/89  bone]
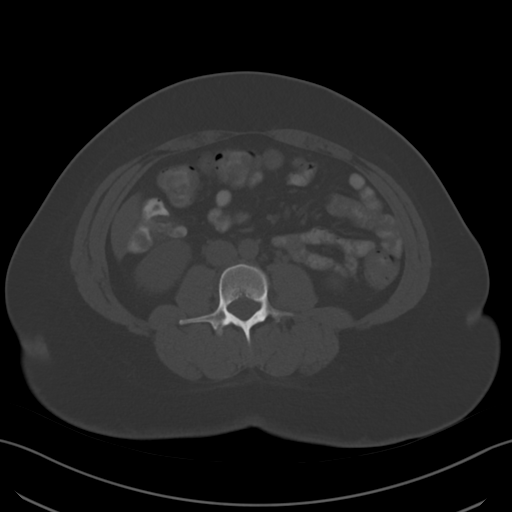
[im 60/89  soft-tissue]
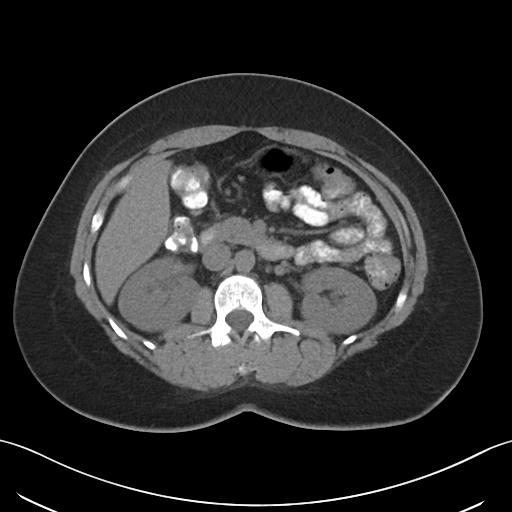
[im 67/89  soft-tissue]
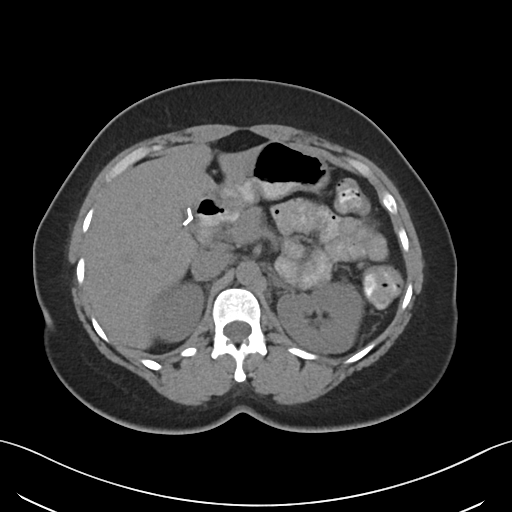
[im 71/89  soft-tissue]
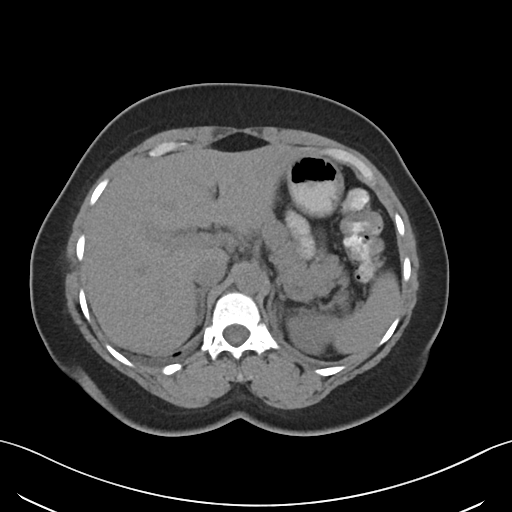
[im 78/89  soft-tissue]
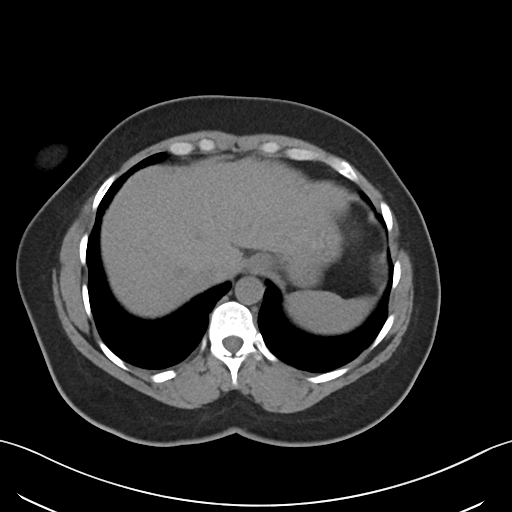
[im 85/89  soft-tissue]
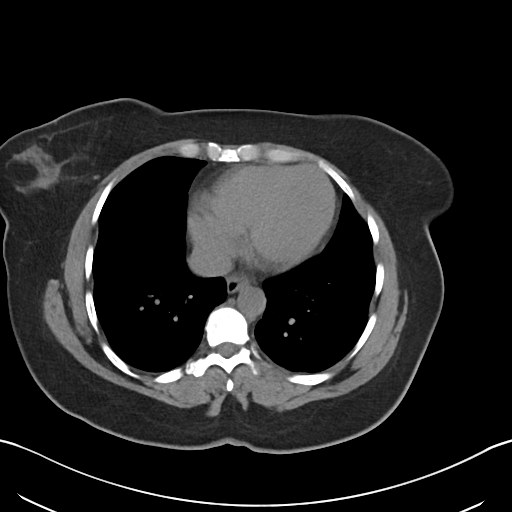

[Series 5: coronal · coronal · 0.82mm/px · 3 of 99 slices shown]
[im 33/99  soft-tissue]
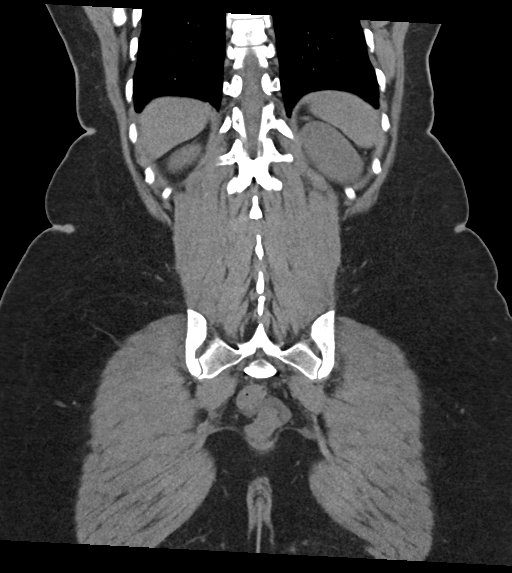
[im 44/99  soft-tissue]
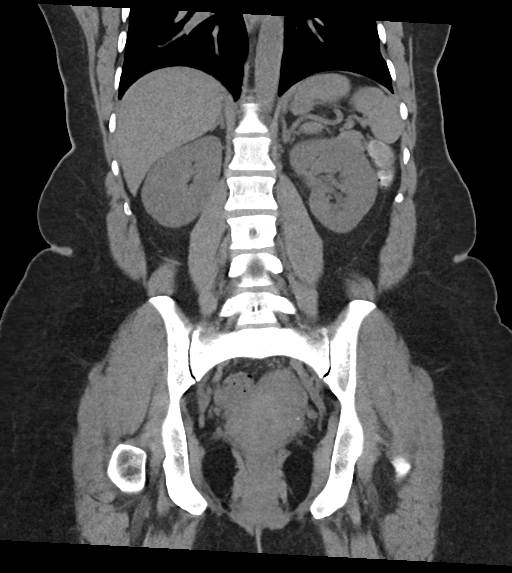
[im 55/99  soft-tissue]
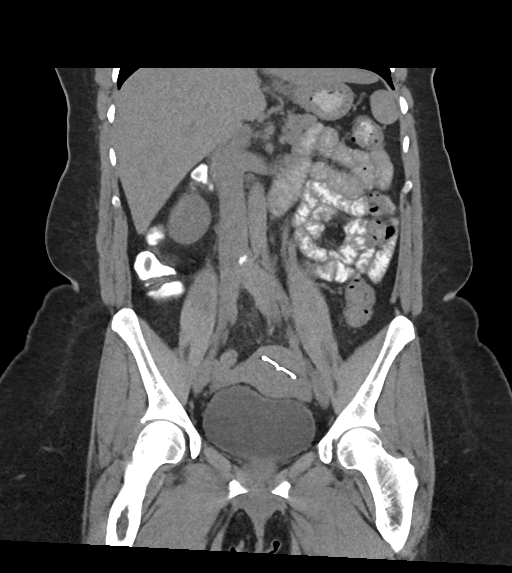

[17 of 46 positions shown; findings below may reference images not displayed]

FINDINGS: Lower chest: No acute abnormality.

Hepatobiliary: No focal liver abnormality is seen. Prior
cholecystectomy.

Pancreas: Unremarkable. No pancreatic ductal dilatation or
surrounding inflammatory changes.

Spleen: Normal in size without focal abnormality.

Adrenals/Urinary Tract: Adrenal glands are unremarkable. No
hydronephrosis or nephrolithiasis. Bladder is moderately distended.

Stomach/Bowel: The stomach is within normal limits. There is no
evidence of bowel obstruction.Prior appendectomy. No acute
inflammatory process involving the bowel.

Vascular/Lymphatic: No significant vascular findings are present. No
enlarged abdominal or pelvic lymph nodes.

Reproductive: IUD is in place.

Other: No abdominal wall hernia or abnormality. Trace nonspecific
free fluid in the pelvis, likely physiologic.

Musculoskeletal: No acute or significant osseous findings.
IMPRESSION: No acute findings in the abdomen or pelvis.

## 2023-02-01 ENCOUNTER — Other Ambulatory Visit: Payer: Self-pay | Admitting: Family Medicine

## 2023-02-01 DIAGNOSIS — G8929 Other chronic pain: Secondary | ICD-10-CM

## 2023-02-01 NOTE — Progress Notes (Signed)
Injury 35 years ago to L ankle.  Since that time pt has noted ankle will "pop out of joint." Recent episode was very painful.  Laxity on passive ROM noted on exam Ankle Xray ordered.

## 2023-02-04 ENCOUNTER — Other Ambulatory Visit: Payer: Self-pay | Admitting: Family Medicine

## 2023-02-04 DIAGNOSIS — M545 Low back pain, unspecified: Secondary | ICD-10-CM

## 2023-02-04 NOTE — Progress Notes (Signed)
Acute onset L lower back pain. Feels like a pinched nerve per pt. No radiation or associated sx. Constant w/ slight improvement while on Ibuprofen 600.  Lumbar xray ordered.

## 2023-02-05 ENCOUNTER — Ambulatory Visit
Admission: RE | Admit: 2023-02-05 | Discharge: 2023-02-05 | Disposition: A | Discharge status: Home / Self Care | Payer: 59 | Source: Ambulatory Visit | Attending: Family Medicine | Admitting: Family Medicine

## 2023-02-05 DIAGNOSIS — G8929 Other chronic pain: Secondary | ICD-10-CM

## 2023-02-05 DIAGNOSIS — M545 Low back pain, unspecified: Secondary | ICD-10-CM
